# Patient Record
Sex: Male | Born: 1960 | Race: Black or African American | Hispanic: No | Smoking: Current every day smoker
Health system: Southern US, Community
[De-identification: ages and names within clinical notes are randomized; demographics above are authoritative.]

## PROBLEM LIST (undated history)

## (undated) DIAGNOSIS — E785 Hyperlipidemia, unspecified: Secondary | ICD-10-CM

## (undated) DIAGNOSIS — R55 Syncope and collapse: Secondary | ICD-10-CM

## (undated) DIAGNOSIS — I10 Essential (primary) hypertension: Secondary | ICD-10-CM

## (undated) DIAGNOSIS — Z86010 Personal history of colonic polyps: Secondary | ICD-10-CM

## (undated) DIAGNOSIS — T7840XA Allergy, unspecified, initial encounter: Secondary | ICD-10-CM

## (undated) DIAGNOSIS — C61 Malignant neoplasm of prostate: Secondary | ICD-10-CM

## (undated) DIAGNOSIS — M1990 Unspecified osteoarthritis, unspecified site: Secondary | ICD-10-CM

## (undated) HISTORY — DX: Hyperlipidemia, unspecified: E78.5

## (undated) HISTORY — DX: Personal history of colonic polyps: Z86.010

## (undated) HISTORY — PX: PROSTATE BIOPSY: SHX241

## (undated) HISTORY — PX: COLONOSCOPY: SHX174

## (undated) HISTORY — DX: Allergy, unspecified, initial encounter: T78.40XA

## (undated) HISTORY — PX: HEMORRHOID SURGERY: SHX153

## (undated) HISTORY — DX: Syncope and collapse: R55

## (undated) HISTORY — PX: INGUINAL HERNIA REPAIR: SUR1180

---

## 1998-05-29 ENCOUNTER — Emergency Department (HOSPITAL_COMMUNITY): Admission: EM | Admit: 1998-05-29 | Discharge: 1998-05-29 | Payer: Self-pay | Admitting: Emergency Medicine

## 1999-01-27 ENCOUNTER — Emergency Department (HOSPITAL_COMMUNITY): Admission: EM | Admit: 1999-01-27 | Discharge: 1999-01-27 | Payer: Self-pay | Admitting: Emergency Medicine

## 2002-01-10 ENCOUNTER — Emergency Department (HOSPITAL_COMMUNITY): Admission: EM | Admit: 2002-01-10 | Discharge: 2002-01-10 | Payer: Self-pay | Admitting: Emergency Medicine

## 2002-10-02 ENCOUNTER — Encounter: Payer: Self-pay | Admitting: Emergency Medicine

## 2002-10-02 ENCOUNTER — Emergency Department (HOSPITAL_COMMUNITY): Admission: EM | Admit: 2002-10-02 | Discharge: 2002-10-02 | Payer: Self-pay | Admitting: Emergency Medicine

## 2005-04-21 ENCOUNTER — Encounter: Payer: Self-pay | Admitting: Emergency Medicine

## 2005-04-22 ENCOUNTER — Observation Stay (HOSPITAL_COMMUNITY): Admission: EM | Admit: 2005-04-22 | Discharge: 2005-04-22 | Payer: Self-pay | Admitting: Internal Medicine

## 2005-08-02 ENCOUNTER — Ambulatory Visit (HOSPITAL_BASED_OUTPATIENT_CLINIC_OR_DEPARTMENT_OTHER): Admission: RE | Admit: 2005-08-02 | Discharge: 2005-08-02 | Payer: Self-pay | Admitting: General Surgery

## 2005-08-02 ENCOUNTER — Encounter (INDEPENDENT_AMBULATORY_CARE_PROVIDER_SITE_OTHER): Payer: Self-pay | Admitting: Specialist

## 2006-03-22 DIAGNOSIS — R55 Syncope and collapse: Secondary | ICD-10-CM

## 2006-03-22 HISTORY — DX: Syncope and collapse: R55

## 2006-09-18 ENCOUNTER — Ambulatory Visit: Payer: Self-pay | Admitting: Sports Medicine

## 2006-09-18 ENCOUNTER — Observation Stay (HOSPITAL_COMMUNITY): Admission: EM | Admit: 2006-09-18 | Discharge: 2006-09-19 | Payer: Self-pay | Admitting: Emergency Medicine

## 2006-09-19 ENCOUNTER — Encounter (INDEPENDENT_AMBULATORY_CARE_PROVIDER_SITE_OTHER): Payer: Self-pay | Admitting: Sports Medicine

## 2008-02-16 IMAGING — CR DG CHEST 1V PORT
1 series · 1 of 1 positions shown · non-contrast
Comparison: 04/21/05.

CLINICAL DATA: 46 year old male; syncope.
 PORTABLE SINGLE VIEW CHEST RADIOGRAPH ? 09/19/06:

[view not recorded]
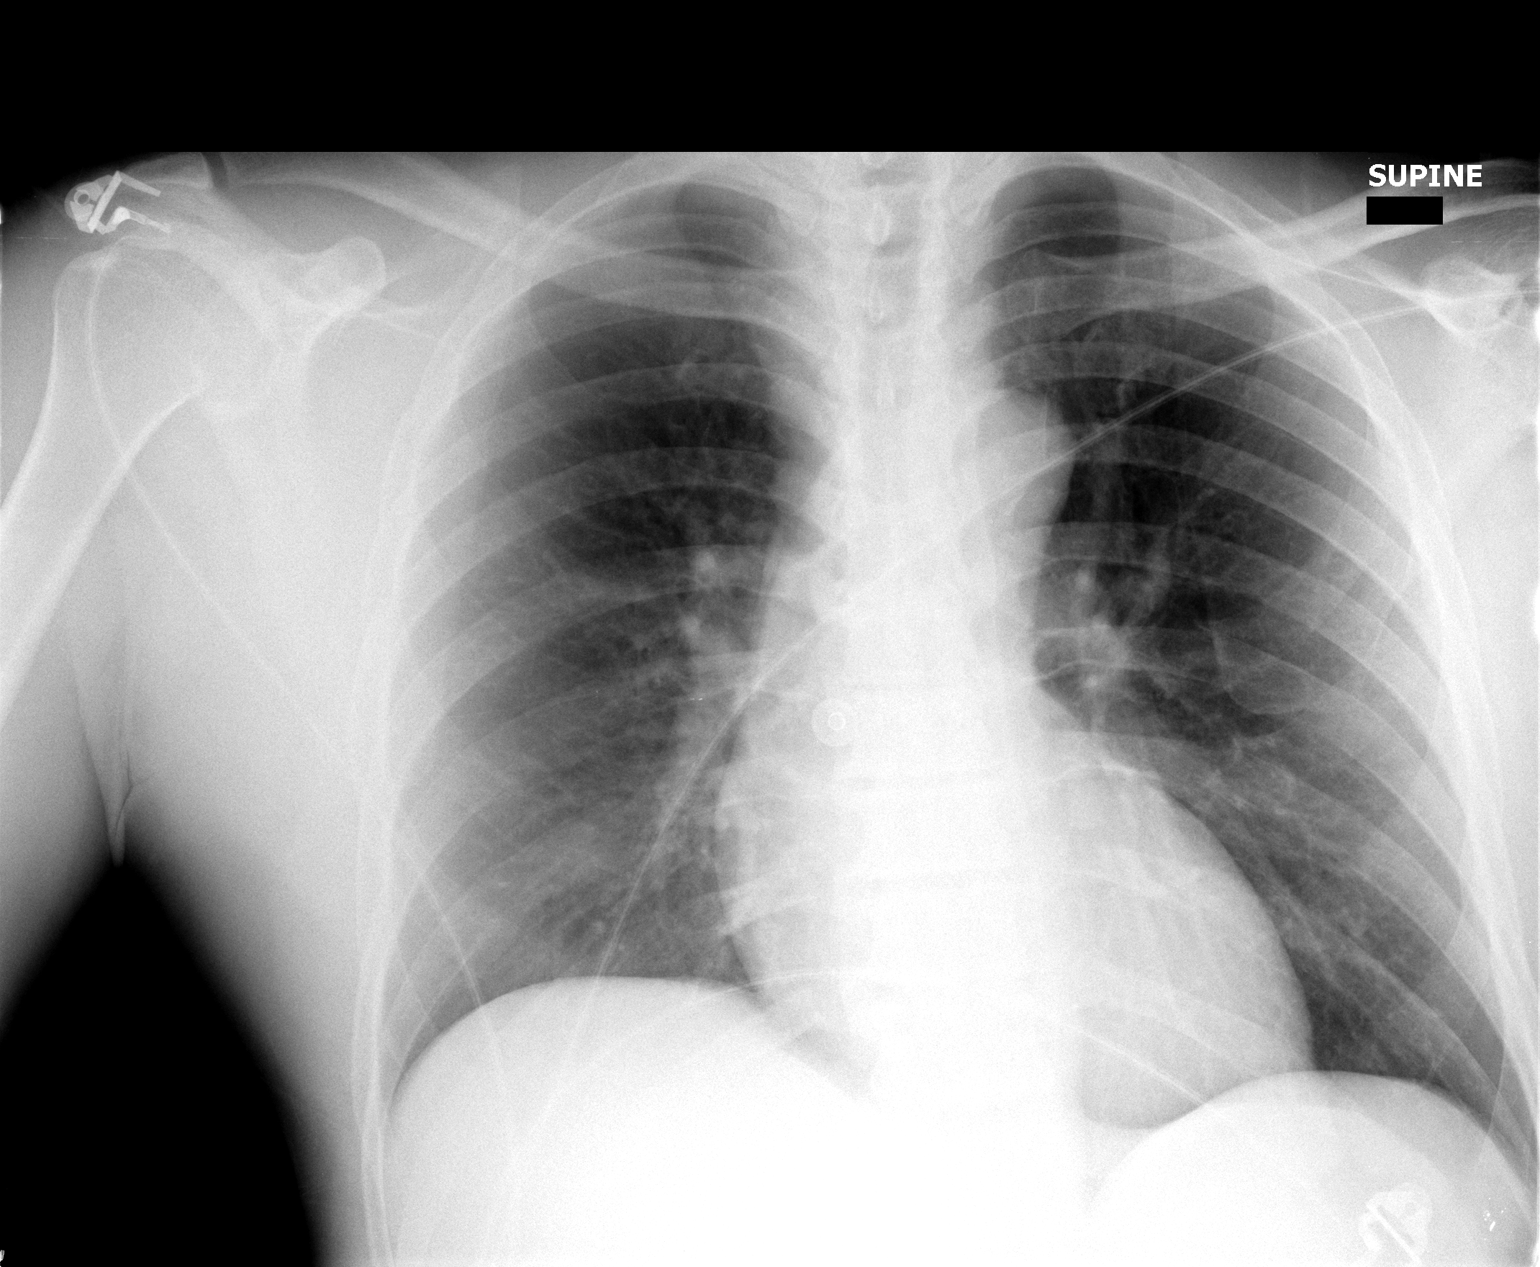

[1 of 1 positions shown; findings below may reference images not displayed]

FINDINGS: The heart size and mediastinal contours are within normal limits.  Both lungs are clear.
IMPRESSION: No acute disease.

## 2010-08-04 NOTE — H&P (Signed)
NAME:  Jeffrey Harrington, Jeffrey Harrington NO.:  [id number]   MEDICAL RECORD NO.:  [id number]          PATIENT TYPE:  INP   LOCATION:  4733                         FACILITY:  [facility]   PHYSICIAN:  Benn Moulder, M.D.      DATE OF BIRTH:  February 20, 1961   DATE OF ADMISSION:  09/18/2006  DATE OF DISCHARGE:                              HISTORY & PHYSICAL   CHIEF COMPLAINT:  Syncope, not feeling well.   HISTORY OF PRESENT ILLNESS:  The patient is a 50 year old African  American male with past medical history of hypertension, not currently  taking his medications for the past 6 months, who began feeling dizzy  Friday morning.  At a grocery store, he suddenly blacked out for a few  seconds.  He continued to feel fatigued and dizzy, but went to work.  He  denies any chest pain, shortness of breath, palpitations.  He has no  prior history of syncope.  Over the weekend, he has continued to feel  weak and dizzy with some intermittent diaphoresis as well as  intermittent wheezing and congestion.  He denies any fevers.  He went to  urgent care this morning because he is still feeling bad and is found to  be hypertensive with blood pressure of 197/111.  He does have a  significant drinking history, drinking approximately 5 beers per day  with last beers last night.  He denies ever having an alcohol withdrawal  symptoms.  He was transferred from urgent care to [facility]  for further evaluation.   PAST MEDICAL HISTORY:  1. Hypertension.  2. Tobacco abuse.  3. Alcohol abuse.  4. Right inguinal hernia status post repair in May 2007.   CURRENT MEDICATIONS:  None.   ALLERGIES:  NO KNOWN DRUG ALLERGIES.   FAMILY HISTORY:  Mom with hypertension and diabetes.  Dad died of liver  disease secondary to alcohol abuse.  Grandfather with stroke.  Brother  died at age 36 with throat cancer.   SOCIAL HISTORY:  He works at a [employer] forces logistics/support/administrative officer.  He is married with 2  children.  Positive tobacco use,  1/2-pack per day.  Drinks 5 to 6 beers  per day.  No history of alcohol withdrawal or DTs.  Positive for 2/4 on  the [facility] questionnaire, wanting to cut back and feeling annoyed when  people are asking about his drinking.  Denies any other drug use.   REVIEW OF SYSTEMS:  No nausea, vomiting, diarrhea, chest pain, shortness  of breath, or palpitations.  No headaches or vision changes.  Remainder  of systems as per HPI.   PHYSICAL EXAMINATION:  VITAL SIGNS:  Temperature 97.4, pulse 95, blood  pressure 187/100, respiratory rate 20, 99% on room air, peak flow was  450 at [place].  GENERAL:  He is alert, in no acute distress.  HEENT:  Pupils are equally round and reactive to light and  accommodation.  Extraocular motion are intact.  Normal optic fundi  bilaterally.  NECK:  No carotid bruits, no JVD, no thyromegaly, or thyroid nodules.  CARDIOVASCULAR:  Regular rate and rhythm.  A 2/6 systolic ejection  murmur, right upper sternal border that does not radiate.  LUNGS:  No increased work of breathing, occasional and expiratory  wheeze.  ABDOMEN:  Positive bowel sounds.  Soft, nontender, nondistended.  EXTREMITIES:  No cyanosis, clubbing, or edema.  A 2+ radial and dorsalis  pedis and posterior tibialis pulses.  NEURO:  Alert and oriented x3.  Cranial nerves II-XII are grossly  intact.  A 5/5 strength.   LABORATORY DATA:  At [facility], white blood cell count 5.7,  hemoglobin 16.4, hematocrit 48, platelet count 293.   Chest x-ray showed hyperinflation with no focal infiltrate.  EKG normal  sinus rhythm with left ventricular hypertrophy, but no change from prior  EKGs.   ASSESSMENT AND PLAN:  A 50 year old male with hypertension, not  currently on medications, now syncopal event.  1. Syncope.  Broad differential diagnosis including neurocardiogenic,      cardiogenic causes or orthostasis or substance abuse.  We will      monitor on telemetry.  We will check cardiac enzymes.  We  will      check orthostatic's as well as urine drug screen and blood alcohol      level.  The patient will likely need an outpatient cardiology      workup if cardiac enzymes are negative.  We will check fasting      lipid panels, further stratify.  2. Hypertension, likely chronic.  No signs or symptoms of hypertensive      emergency at this point.  We will monitor for an organ damage.  We      will start lisinopril/HCTZ initially.  We will certainly add      intravenous medications if he develops signs of hypertensive      emergency.  We will check urine drug screen and echocardiogram.  3. Pulmonary.  Patient with hyperinflation on chest x-ray.  History of      smoking.  He does have a little bit of wheezing on exam.  Suspect      some underlying chronic obstructive pulmonary disease.  We will      start albuterol neb treatments and check pulmonary function tests      before and after bronchodilators.  He did a _______ with normal      white counts and pneumonia most likely.  We will re-hydrate.  Check      chest x-ray again in the morning.  4. Alcohol use.  We will start Ativan as needed.  Watch closely for      signs or symptoms of withdrawal.  We will given thymine and folate      and check blood alcohol level.  5. Tobacco abuse.  Will obtain smoking cessation consult.  The patient      does not want patch.  The patient is strongly encouraged to stop      smoking given his comorbidities.      Benn Moulder, M.D.     MR/MEDQ  D:  09/18/2006  T:  09/19/2006  Job:  [id number]

## 2010-08-07 NOTE — Op Note (Signed)
NAME:  Jeffrey Harrington, MANGEL NO.:  [id number]   MEDICAL RECORD NO.:  [id number]          PATIENT TYPE:  AMB   LOCATION:  DSC                          FACILITY:  [facility]   PHYSICIAN:  Ollen Gross. Vernell Morgans, M.D. DATE OF BIRTH:  [birth year]   DATE OF PROCEDURE:  08/05/2005  DATE OF DISCHARGE:  08/02/2005                                 OPERATIVE REPORT   PREOPERATIVE DIAGNOSES:  Right inguinal hernia.   POSTOP DIAGNOSIS:  Right indirect inguinal hernia.   PROCEDURE:  Right inguinal repair with mesh.   SURGEON:  Dr. Carolynne Edouard.   ANESTHESIA:  General via LMA.   PROCEDURE:  After informed consent was obtained, the patient brought to the  operating room placed in supine position operating room table.  After  induction of general anesthesia, the patient's right groin and abdomen were  prepped with Betadine and draped in usual sterile manner.  The area of right  groin was infiltrated 0.25% Marcaine with epinephrine.  A small incision was  made from the edge of the pubic tubercle on the right towards the anterior  iliac spine for a distance of about 5 cm.  This incision was carried down  through the skin and subcutaneous tissue sharply with electrocautery.  A  small bridging vein was encountered that was clamped with hemostats, divided  and ligated with 3-0 silk ties.  The rest of the dissection was carried  sharply through the subcutaneous tissue with electrocautery until the fascia  of the external oblique was encountered.  The fascia of the external oblique  was opened along its fibers with a 15 blade knife and Metzenbaum scissors  towards the apex of the external ring.  Once this was accomplished,  Weitlaner retractor was deployed.  Blunt dissection was then carried out of  the cord structures at the edge of the pubic tubercle until the cord  structures could be surrounded between two fingers.  A 1/2-inch Penrose  drain was then placed around the cord structures for retraction  purposes.  There did not appear to be any direct defect.  The cord structures were then  gently skeletonized by combination of blunt hemostat dissection and sharp  dissection with electrocautery.  The hernia sac was identified and gently  separated from the rest of the cord structures.  The hernia sac was then  opened sharply with the Metzenbaum scissors.  There were no visceral  contents within the sac.  The sac was then ligated near its base with a 2-0  silk suture ligature.  The distal sac was excised sharply with the  Metzenbaum scissors and the stump of the sac was allowed to retract back  beneath the transversalis layer.  Next a 3 x 6 piece of Ultrapro mesh was  chosen and cut to fit.  The mesh was sewed inferiorly to the shelving edge  of inguinal ligament with a running 2-0 Prolene stitch.  Tails were cut in  the mesh laterally and the tails of the mesh were wrapped around the cord  structures superiorly.  The mesh was sewed to the muscular aponeurotic  strength layer of the transversalis with interrupted 2-0 Prolene vertical  mattress stitches.  The tails were anchored laterally to the shelving edge  of the inguinal ligament with an interrupted 2-0 Prolene stitch.  Once this  was accomplished, the mesh was in good position without any tension.  The  wound was irrigated copious amounts of saline.  The ilioinguinal nerve was  also identified and was involved with a fair bit of scar tissue.  The nerve  was then therefore clamped proximally and distally with hemostats divided  and ligated with 3-0 silk ties.  The external oblique fascia was then  reapproximated with a running 2-0 Vicryl stitch.  The wound was infiltrated  with more 0.25% Marcaine with epinephrine.  The subcutaneous fascia was  closed with a running 3-0 Vicryl stitch and the skin was closed with a  running 4-0 Monocryl subcuticular stitch.  Benzoin, Steri-Strips and sterile  dressings were  applied.  The patient  tolerated well.  At the end of the case, all needle,  sponge and instrument counts correct.  The patient was awakened and taken  recovery in stable condition.  The patient's testicle was in the sac at the  end of the case.      Ollen Gross. Vernell Morgans, M.D.  Electronically Signed     PST/MEDQ  D:  08/05/2005  T:  08/05/2005  Job:  [id number]

## 2010-08-07 NOTE — Discharge Summary (Signed)
NAME:  Jeffrey Harrington, Jeffrey Harrington NO.:  [id number]   MEDICAL RECORD NO.:  [id number]          PATIENT TYPE:  INP   LOCATION:  6705                         FACILITY:  [facility]   PHYSICIAN:  Elliot Cousin, M.D.    DATE OF BIRTH:  08/26/1960   DATE OF ADMISSION:  04/21/2005  DATE OF DISCHARGE:  04/22/2005                                 DISCHARGE SUMMARY   DISCHARGE DIAGNOSES:  1.  Hypertensive urgency.  2.  Right inguinal hernia, reducible.  3.  Hypokalemia.  4.  Tobacco abuse.   DISCHARGE MEDICATIONS:  1.  Metoprolol 50 mg, half a tablet b.i.d.  2.  Hydrochlorothiazide 25 mg, half a tablet daily.  3.  Potassium chloride 20 mEq, half a tablet daily take with HCTZ.   HISTORY OF PRESENT ILLNESS:  The patient is a 50 year old man with a past  medical history significant for tobacco use, who presented to the emergency  department with a chief complaint of right groin pain and bulge.  The  patient was treated for an upper respiratory infection a couple of weeks  ago.  However, during the course of the upper respiratory infection, he had  multiple episodes of coughing.  He developed a sharp pain with a bulge in  his right groin.  After a couple of days the pain and bulge went away.  The  right groin pain and bulge again returned following lifting.  He has been  able to push the bulge back in each time.  However, he decided to present to  the emergency department for further evaluation.  When the patient was  evaluated in the emergency department, he was found to have a diastolic  blood pressure ranging from 120 to 143.  The patient was, therefore,  admitted for further evaluation and management.   HOSPITAL COURSE:  1.  HYPERTENSIVE URGENCY.  Treatment started by the emergency department      physician Dr. Adriana Simas. The patient was given intravenous labetalol twice.      He was subsequently started on a nitroglycerin drip.  His chest x-ray,      on admission, revealed no acute  cardiopulmonary disease.  His EKG, on      admission, revealed normal sinus rhythm, nonspecific T-wave and ST wave      abnormalities, and a prolonged QT interval.  The patient had no      complaints of chest pain or shortness of breath on admission.  He was      eventually admitted to the step-down unit.  The nitroglycerin drip was      tapered off and the patient was started on labetalol 100 mg p.o. b.i.d.      After several hours of hospitalization, the patient's blood pressures      improved.  The patient wanted to go home following the improvement in      his blood pressures.  A repeat EKG revealed normal sinus rhythm and a      prolonged QT interval (161,096).  A magnesium level was assessed prior      to hospital discharge and  was found to be within normal limits at 2.1.      The patient's request was granted and he was sent home essentially      during the first 24 hours of hospitalization.  At the time of hospital      discharge, the labetalol was discontinued and the patient was started on      metoprolol 50 mg, half a tablet b.i.d. instead. In addition,      hydrochlorothiazide 25 mg, half a tablet daily was added.  The patient      was instructed on a low-salt diet.  He was also admonished to stop      smoking.  More importantly, he was advised to follow up with Dr.      Ronne Binning on a regular basis for routine monitoring of his blood      pressure. A follow-up EKG is recommended.  The patient was informed that      his cholesterol panel and thyroid panel were pending.  He was advised to      ask Dr. Ronne Binning to follow up with these lab results, when he returns to      Dr. Dimas Millin office for hospital followup.   1.  RIGHT INGUINAL HERNIA.  The patient's hernia was manually reduced by the      emergency department physician without any complications.  There was no      evidence of incarceration on examination.  On followup examinations by      the dictating physician, the  patient had no evidence of an incarcerated      or strangulated right groin/inguinal hernia. The patient was advised to      follow up with Dr. Ronne Binning. An elective surgical evaluation is      recommended.   1.  HYPOKALEMIA.  The patient's serum potassium was 3.3 at the time of      hospital admission.  He was treated with 30 mEq of potassium chloride      prior to hospital discharge.  His magnesium level, as stated above, was      within normal limits at 2.1.  He was discharged to home with a      prescription for potassium chloride.   1.  TOBACCO ABUSE.  The patient was admonished to stop smoking.   PENDING LABORATORY:  Fasting lipid panel, TSH.      Elliot Cousin, M.D.  Electronically Signed     DF/MEDQ  D:  04/24/2005  T:  04/24/2005  Job:  [id number]   cc:   Lorelle Formosa, M.D.  Fax: [phone]

## 2010-08-07 NOTE — Discharge Summary (Signed)
NAME:  Jeffrey Harrington, Jeffrey Harrington NO.:  [id number]   MEDICAL RECORD NO.:  [id number]          PATIENT TYPE:  OBV   LOCATION:  4733                         FACILITY:  [facility]   PHYSICIAN:  Melina Fiddler, MD DATE OF BIRTH:  May 22, 1960   DATE OF ADMISSION:  09/18/2006  DATE OF DISCHARGE:  09/19/2006                               DISCHARGE SUMMARY   DISCHARGE DIAGNOSES:  1. Syncope.  2. Hypertension.  3. Wheezing and hyperinflation on chest x-ray.  4. Alcohol use.  5. Tobacco abuse.   DISCHARGE MEDICATIONS:  1. Aspirin 81 mg daily.  2. Lisinopril/HCTZ 20 mg/25 mg daily.  3. Albuterol inhaler two puffs inhaled every four hours as needed for      shortness of breath.   CONSULTS:  None.   PROCEDURES:  A 2-D echocardiogram showed normal left ventricular  systolic function with an EF of 60%, no left ventricular regional wall  abnormalities, mildly increased left ventricular wall thickness.  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   LABORATORY DATA:  With a cholesterol of 194, triglycerides of 82, HDL  cholesterol of 86, LDL cholesterol of 92.  Three sets of cardiac enzymes  were negative.  White blood count was 5.1, hemoglobin was 15.8,  hematocrit 47.3, platelets of 222.  Sodium of 137, potassium of 4.2,  chloride of 96, bicarb of 32, glucose of 103, BUN of 8, creatinine of  0.93, calcium of 9.4.  TSH of 1.244.   HOSPITAL COURSE:  Jeffrey Harrington is a 49 year old African-American with a  past medical history of hypertension who had not been taking his  medications for six months who had began to feel dizzy prior to  admission.  He blacked out for a few seconds at a grocery store and  continued to feel fatigue and dizzy with some intermittent diaphoresis  as well as intermittent wheezing.  He was found to have a blood pressure  at urgent care of 197/111 and he was transferred to the [facility] emergency room for admission for elevated blood pressure.   1. Syncope.  Patient has a broad differential for his episode of      syncope.  We admitted him, checked cardiac enzymes which were all      negative.  In addition, the patient's EKG showed no abnormalities      and he had no abnormalities on telemetry during the hospital stay.      A UDS was negative.  A blood alcohol level was negative.  Patient      was not orthostatic.  Patient's syncope likely could be from      severely elevated blood pressure.  It was recommended at time of      discharge that the patient follow up with a cardiologist for      further risk stratification and stress testing.  Lipid panel was      within normal limits and at goal.  2. Hypertension.  Patient had no signs or symptoms of hypertensive      emergency at the point of this admission.  We restarted the      patient's lisinopril and HCTZ; the patient had improved blood      pressure control on this medication, likely will need additional      medications in the future.  He will follow up with his primary care      physician regarding the need for additional medications.  3. Wheezing and hyperinflation on chest x-ray.  The patient has a      history of smoking and had wheezing on exam.  We suspected during      this admission that the patient had some underlying chronic      obstructive pulmonary disease.  The patient's wheezing did resolve      with albuterol neb treatments so it is possible that this is a      component of asthma as well.  We recommend pulmonary function tests      as an outpatient upon discharge.  4. Alcohol use.  Patient had a negative alcohol level on admission,      but does have a significant history of alcohol use and we did order      p.r.n. Ativan as needed for signs and symptoms of withdrawal;      patient had none of these during the hospital stay.  5. Tobacco abuse.  We obtained a smoking cessation consult during the      hospitalization and patient was motivated to quit.  The  patient is      to follow up at [facility] upon discharge.      Levander Campion, M.D.  Electronically Signed     ______________________________  Melina Fiddler, MD    JH/MEDQ  D:  10/04/2006  T:  10/05/2006  Job:  [id number]

## 2010-08-07 NOTE — H&P (Signed)
NAME:  Jeffrey Harrington, MAHON NO.:  [id number]   MEDICAL RECORD NO.:  [id number]          PATIENT TYPE:  EMS   LOCATION:  ED                           FACILITY:  [facility]   PHYSICIAN:  Hettie Holstein, D.O.    DATE OF BIRTH:  09-26-1960   DATE OF ADMISSION:  04/21/2005  DATE OF DISCHARGE:                                HISTORY & PHYSICAL   PRIMARY CARE PHYSICIAN:  Lorelle Formosa, M.D.   CHIEF COMPLAINT:  Chest cold.   HISTORY OF PRESENT ILLNESS:  Jeffrey Harrington is a pleasant 50 year old relatively  healthy African-American male who has not seen a primary care physician in  over a year.  He did present to [facility] about a couple of weeks ago with  some upper respiratory congestion and he was told that he had borderline  high blood pressure.  In any event, he had been doing okay until he  developed this cough and he developed a sharp pain in his right groin on  Sunday night and subsequently this went away after a couple of days, though  he had to remain off of work for a couple of days.  On Tuesday while he was  lifting a box for his wife, he developed his right groin pain and subsequent  bulge.  He states that he was able to push it back in, but every time he  coughed it would pop off.  Subsequently he presented to the emergency  department and this was reduced.  There was no evidence of incarceration on  examination.   However, prior to being discharged home by the emergency department, he was  noted to have hypertensive urgency refractory to ED therapies.  He is being  admitted for management of hypertensive urgency at this time with diastolics  consistently over 120 and going as high as 143 diastolic.  In any event, he  states that he has no prior history of hypertension in the past.   PAST MEDICAL HISTORY:  This is unremarkable.  He has not had any problems  with hypertension or diabetes in the past.  He has been relatively healthy  according to him.   MEDICATIONS:   He takes no medications.   ALLERGIES:  No known drug allergies.   FAMILY HISTORY:  His mother is alive at age 30, father died at age 81 as a  result of chronic alcohol abuse.   SOCIAL HISTORY:  The patient has been smoking for about 15 years, half a  pack per day.  He works in Teaching laboratory technician and receiving.  He is married and has  three children and drinks beer occasionally.   REVIEW OF SYSTEMS:  He has been in his usual state of health.  No swelling,  no chest pain.  Just this upper respiratory tract infection that has been  persistent.   PHYSICAL EXAMINATION:  VITAL SIGNS:  Blood pressure 153/110, heart rate was  in the 80's, respirations 20, O2 saturation 95%.  He was afebrile.  GENERAL:  The patient is alert and in no acute distress.  NECK:  Supple and  nontender.  No palpable mass.  CARDIOVASCULAR:  Normal S1 and S2, nondisplaced PMI.  LUNGS:  Clear to auscultation bilaterally.  ABDOMEN:  Soft.  He does have palpable defect in his right inguinal area  above the ilioinguinal ligament.  There is no palpable incarcerated bowel.  His bowel sounds are normal active and he is currently without pain or  discomfort.  EXTREMITIES:  There is no edema.  Peripheral pulses are symmetrical and  palpable bilaterally.   LABORATORY DATA:  There is none obtained in the emergency department.  I am  ordering these at this time as well as the EKG which has not been ordered in  the emergency department as well.   ASSESSMENT:  1.  Hypertensive urgency.  2.  Right inguinal hernia reducible.  3.  Tobacco abuse.   PLAN:  We are going to admit Jeffrey Harrington to a CCU floor where he can receive  aggressive IV therapy for his hypertensive urgency and we will check some  routine labs including CBC, BNP, EKG, fasting lipid profile.  He is going to  need continuity with a primary care physician.  We will initiate Normodyne  for now to adequately control his blood pressure.  He may need further  adjustments in  the outpatient setting as well as referral to a general  surgeon to address his right inguinal hernia.      Hettie Holstein, D.O.  Electronically Signed     ESS/MEDQ  D:  04/22/2005  T:  04/22/2005  Job:  [id number]   cc:   Lorelle Formosa, M.D.  Fax: [phone]

## 2011-01-01 ENCOUNTER — Encounter: Payer: Self-pay | Admitting: *Deleted

## 2011-01-01 ENCOUNTER — Emergency Department (HOSPITAL_BASED_OUTPATIENT_CLINIC_OR_DEPARTMENT_OTHER)
Admission: EM | Admit: 2011-01-01 | Discharge: 2011-01-01 | Disposition: A | Discharge status: Home / Self Care | Payer: 59 | Attending: Emergency Medicine | Admitting: Emergency Medicine

## 2011-01-01 DIAGNOSIS — F17200 Nicotine dependence, unspecified, uncomplicated: Secondary | ICD-10-CM | POA: Insufficient documentation

## 2011-01-01 DIAGNOSIS — I10 Essential (primary) hypertension: Secondary | ICD-10-CM | POA: Insufficient documentation

## 2011-01-01 DIAGNOSIS — M79609 Pain in unspecified limb: Secondary | ICD-10-CM | POA: Insufficient documentation

## 2011-01-01 DIAGNOSIS — Z79899 Other long term (current) drug therapy: Secondary | ICD-10-CM | POA: Insufficient documentation

## 2011-01-01 DIAGNOSIS — M79603 Pain in arm, unspecified: Secondary | ICD-10-CM

## 2011-01-01 HISTORY — DX: Essential (primary) hypertension: I10

## 2011-01-01 MED ORDER — PREDNISONE 20 MG PO TABS
ORAL_TABLET | ORAL | Status: AC
  Administered 2011-01-01: 20:00:00
  Filled 2011-01-01: qty 1

## 2011-01-01 MED ORDER — PREDNISONE 10 MG PO TABS: 20.0000 mg | ORAL_TABLET | Freq: Every day | ORAL | Status: AC

## 2011-01-01 MED ORDER — IBUPROFEN 400 MG PO TABS
400.0000 mg | ORAL_TABLET | Freq: Once | ORAL | Status: AC
  Administered 2011-01-01: 400 mg via ORAL
  Filled 2011-01-01: qty 1

## 2011-01-01 MED ORDER — DIAZEPAM 5 MG PO TABS
5.0000 mg | ORAL_TABLET | Freq: Once | ORAL | Status: AC
  Administered 2011-01-01: 5 mg via ORAL
  Filled 2011-01-01: qty 1

## 2011-01-01 MED ORDER — HYDROMORPHONE HCL 1 MG/ML IJ SOLN
1.0000 mg | Freq: Once | INTRAMUSCULAR | Status: AC
  Administered 2011-01-01: 1 mg via INTRAMUSCULAR
  Filled 2011-01-01: qty 1

## 2011-01-01 MED ORDER — PREDNISONE 20 MG PO TABS
40.0000 mg | ORAL_TABLET | Freq: Once | ORAL | Status: AC
  Administered 2011-01-01: 40 mg via ORAL
  Filled 2011-01-01: qty 1

## 2011-01-01 MED ORDER — OXYCODONE-ACETAMINOPHEN 5-325 MG PO TABS: 2.0000 | ORAL_TABLET | ORAL | Status: AC | PRN

## 2011-01-01 NOTE — ED Notes (Signed)
Pt sts his left hand began hurting last week and the pain started radiating up his left arm. Pt sts the pain also started in his right hand 2 days ago.

## 2011-01-05 NOTE — ED Provider Notes (Signed)
History    50yM with b/l arm paibn. Gradual onset almost a week ago. First L hand and wrist and now R arm starting 2-3d ago. 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 No fever or chills. No rash. No numbness,tingling or loss of strength. No hx of similar symptoms. No cp or sob. CSN: [id number] Arrival date & time: 01/01/2011  5:56 PM  Chief Complaint  Patient presents with  . Arm Pain    (Consider location/radiation/quality/duration/timing/severity/associated sxs/prior treatment) Patient is a 50 y.o. male presenting with arm pain.  Arm Pain    Past Medical History  Diagnosis Date  . Hypertension     Past Surgical History  Procedure Date  . Hemorrhoid surgery     No family history on file.  History  Substance Use Topics  . Smoking status: Current Everyday Smoker  . Smokeless tobacco: Not on file  . Alcohol Use: Yes      Review of Systems   Review of symptoms negative unless otherwise noted in HPI.   Allergies  Review of patient's allergies indicates no known allergies.  Home Medications   Current Outpatient Rx  Name Route Sig Dispense Refill  . ACETAMINOPHEN 500 MG PO TABS Oral Take 1,000 mg by mouth once.      Marland Kitchen AMLODIPINE BESYLATE 10 MG PO TABS Oral Take 10 mg by mouth daily.      Marland Kitchen CLONIDINE HCL 0.3 MG PO TABS Oral Take 0.3 mg by mouth 2 (two) times daily.      Marland Kitchen HYDROCHLOROTHIAZIDE 25 MG PO TABS Oral Take 25 mg by mouth daily.      . IBUPROFEN 200 MG PO TABS Oral Take 200 mg by mouth every 6 (six) hours as needed. For pain     . OXYCODONE-ACETAMINOPHEN 5-325 MG PO TABS Oral Take 2 tablets by mouth every 4 (four) hours as needed for pain. 15 tablet 0  . PREDNISONE 10 MG PO TABS Oral Take 2 tablets (20 mg total) by mouth daily. 10 tablet 0  . PREDNISONE 10 MG PO TABS Oral Take 2 tablets (20 mg total) by mouth daily. 10 tablet 0    BP 140/101  Pulse 90  Temp(Src) 98.3 F (36.8 C) (Oral)  Resp 18  SpO2 100%  Physical Exam  Nursing note and vitals  reviewed. Constitutional: He appears well-developed and well-nourished. No distress.  HENT:  Head: Normocephalic and atraumatic.  Eyes: Conjunctivae are normal. Right eye exhibits no discharge. Left eye exhibits no discharge.  Neck: Neck supple.  Cardiovascular: Normal rate, regular rhythm and normal heart sounds.  Exam reveals no gallop and no friction rub.   No murmur heard. Pulmonary/Chest: Effort normal and breath sounds normal. No respiratory distress.  Abdominal: Soft. He exhibits no distension. There is no tenderness.  Musculoskeletal:       Moderate tenderness b/l forearms R>L. No rash. neurovascuallry intact distally. No significant swelling. FROM.  Neurological: He is alert.  Skin: Skin is warm and dry.  Psychiatric: He has a normal mood and affect. His behavior is normal. Thought content normal.    ED Course  Procedures (including critical care time)  Labs Reviewed - No data to display No results found.   1. Arm pain       MDM  50yM with b/l forearm and wrist pain. Suspect overuse injury. Plan symptomatic tx and outptfu. Neuro exam nonfocal. Afebrile. Doubt infectious or blood clot.        Raeford Razor, MD 01/05/11 1004

## 2011-01-06 LAB — LIPID PANEL
LDL Cholesterol: 92
LDL Cholesterol: 97
Total CHOL/HDL Ratio: 2.2
Total CHOL/HDL Ratio: 2.3
Triglycerides: 47
Triglycerides: 82
VLDL: 16
VLDL: 9

## 2011-01-06 LAB — CK TOTAL AND CKMB (NOT AT ARMC)
CK, MB: 6.9 — ABNORMAL HIGH
Relative Index: 0.9
Total CK: 1053 — ABNORMAL HIGH
Total CK: 759 — ABNORMAL HIGH

## 2011-01-06 LAB — CBC
Platelets: 222
WBC: 5.1

## 2011-01-06 LAB — URINE MICROSCOPIC-ADD ON

## 2011-01-06 LAB — BASIC METABOLIC PANEL
BUN: 8
Calcium: 9.4
Creatinine, Ser: 0.93
GFR calc Af Amer: >60
GFR calc non Af Amer: >60

## 2011-01-06 LAB — URINALYSIS, ROUTINE W REFLEX MICROSCOPIC
Glucose, UA: NEGATIVE
Hgb urine dipstick: NEGATIVE
Ketones, ur: 15 — AB
Nitrite: NEGATIVE
Specific Gravity, Urine: 1.022
pH: 7

## 2011-01-06 LAB — COMPREHENSIVE METABOLIC PANEL
ALT: 32
AST: 58 — ABNORMAL HIGH
Alkaline Phosphatase: 63
CO2: 28
Calcium: 9.4
GFR calc Af Amer: >60
GFR calc non Af Amer: >60
Glucose, Bld: 98
Potassium: 3.9
Sodium: 139
Total Protein: 7.6

## 2011-01-06 LAB — RAPID URINE DRUG SCREEN, HOSP PERFORMED
Amphetamines: NOT DETECTED
Barbiturates: NOT DETECTED
Benzodiazepines: NOT DETECTED
Opiates: NOT DETECTED

## 2011-01-06 LAB — TROPONIN I: Troponin I: 0.01

## 2011-01-06 LAB — ETHANOL: Alcohol, Ethyl (B): <5

## 2012-03-22 HISTORY — PX: HEMORRHOID BANDING: SHX5850

## 2012-05-19 ENCOUNTER — Encounter: Payer: Self-pay | Admitting: Internal Medicine

## 2012-05-19 ENCOUNTER — Telehealth: Payer: Self-pay | Admitting: Internal Medicine

## 2012-05-19 NOTE — Telephone Encounter (Signed)
Pt scheduled to see Dr. Leone Payor Monday 05/22/12@11am . Pt aware of appt date and time.

## 2012-05-19 NOTE — Telephone Encounter (Signed)
Left message for pt to call back  °

## 2012-05-22 ENCOUNTER — Ambulatory Visit (INDEPENDENT_AMBULATORY_CARE_PROVIDER_SITE_OTHER): Payer: BC Managed Care – PPO | Admitting: Internal Medicine

## 2012-05-22 ENCOUNTER — Encounter: Payer: Self-pay | Admitting: Internal Medicine

## 2012-05-22 VITALS — BP 146/70 | HR 88 | Ht 69.25 in | Wt 207.0 lb

## 2012-05-22 DIAGNOSIS — K648 Other hemorrhoids: Secondary | ICD-10-CM

## 2012-05-22 DIAGNOSIS — F17200 Nicotine dependence, unspecified, uncomplicated: Secondary | ICD-10-CM

## 2012-05-22 DIAGNOSIS — Z1211 Encounter for screening for malignant neoplasm of colon: Secondary | ICD-10-CM

## 2012-05-22 MED ORDER — NA SULFATE-K SULFATE-MG SULF 17.5-3.13-1.6 GM/177ML PO SOLN: ORAL | Status: DC

## 2012-05-22 MED ORDER — HYDROCORTISONE ACETATE 25 MG RE SUPP: 25.0000 mg | Freq: Every day | RECTAL | Status: DC

## 2012-05-22 NOTE — Patient Instructions (Addendum)
You have been given a separate informational sheet regarding your tobacco use, the importance of quitting and local resources to help you quit.  You have been scheduled for a colonoscopy with propofol. Please follow written instructions given to you at your visit today.  Please use the sample suprep kit you have been given today. If you use inhalers (even only as needed) or a CPAP machine, please bring them with you on the day of your procedure.  Today you have been given handouts on high fiber and hemorrhoid to read and follow.   We have sent the following medications to your pharmacy for you to pick up at your convenience: [facility]  Thank you for choosing me and  Gastroenterology.  Iva Boop, M.D., [facility]

## 2012-05-22 NOTE — Progress Notes (Signed)
Subjective:    Patient ID: Jeffrey Harrington, male    DOB: [birth year], 52 y.o.   MRN: [id number]  HPI The patient is here because of rectal bleeding - small amount bright red blood in commode and with wiping. Intermittent x a few months. Strains to stool at times but not constipated. No abdominal or ano-rectal pain. Has never had a colonoscopy. Works in a [employer] with co-worker who is a patient of mine. He does lift heavy items. GI ROS otherwise negative. No prior hx rectal bleeding.  No Known Allergies Outpatient Prescriptions Prior to Visit  Medication Sig Dispense Refill  . amLODipine (NORVASC) 10 MG tablet Take 10 mg by mouth daily.        Marland Kitchen acetaminophen (TYLENOL) 500 MG tablet Take 1,000 mg by mouth once.        Marland Kitchen ibuprofen (ADVIL,MOTRIN) 200 MG tablet Take 200 mg by mouth every 6 (six) hours as needed. For pain       . cloNIDine (CATAPRES) 0.3 MG tablet Take 0.3 mg by mouth 2 (two) times daily.        . hydrochlorothiazide (HYDRODIURIL) 25 MG tablet Take 25 mg by mouth daily.            Past Medical History  Diagnosis Date  . Hypertension   . Syncope 2008   Past Surgical History  Procedure Laterality Date  . Hemorrhoid surgery    . Inguinal hernia repair Right    History   Social History  . Marital Status: Married    Spouse Name: N/A    Number of Children: 2  . Years of Education:    Occupational History  . [employer]  Company secretary   Social History Main Topics  . Smoking status: Current Every Day Smoker -- 0.50 packs/day for 20 years    Types: Cigarettes  . Smokeless tobacco: Never Used  . Alcohol Use: 7.0 oz/week    14 drink(s) per week     Comment: 2 per day  . Drug Use: No  .       Family History  Problem Relation Age of Onset  . Diabetes Mother   . Lung cancer Father   . Throat cancer Brother     ?    Review of Systems + recent back pain and a cough All other ROS negative or as per HPI    Objective:   Physical Exam General:   Well-developed, well-nourished and in no acute distress Eyes:  anicteric. ENT:   Mouth and posterior pharynx free of lesions.  Neck:   supple w/o thyromegaly or mass.  Lungs: Clear to auscultation bilaterally. Heart:  S1S2, no rubs, murmurs, gallops. Abdomen:  soft, non-tender, no hepatosplenomegaly, hernia, or mass and BS+.  Rectal: Normal anoderm, normal resting tone, no mass, brown stool. Prostate is normal Anoscopy: Inflamed internal hemorrhoids x 3 columns in anal canal Lymph:  no cervical or supraclavicular adenopathy. Extremities:   no edema Skin   no rash. Neuro:  A&O x 3.  Psych:  appropriate mood and  Affect.      Assessment & Plan:  Hemorrhoids, internal, with bleeding - Plan: hydrocortisone (ANUSOL-HC) 25 MG suppository, high-fiber diet  Special screening for malignant neoplasms, colon - Plan: Ambulatory referral to Gastroenterology - colonoscopy - screening exam because I have identified cause of rectal bleeding as hemorrhoids.The risks and benefits as well as alternatives of endoscopic procedure(s) have been discussed and reviewed. All questions answered. The patient agrees to proceed.  Smoker  counseled to quit and resources provided

## 2012-05-26 ENCOUNTER — Encounter: Payer: Self-pay | Admitting: Internal Medicine

## 2012-05-26 ENCOUNTER — Ambulatory Visit (AMBULATORY_SURGERY_CENTER): Payer: BC Managed Care – PPO | Admitting: Internal Medicine

## 2012-05-26 VITALS — BP 146/80 | HR 75 | Temp 98.1°F | Resp 12 | Ht 69.0 in | Wt 207.0 lb

## 2012-05-26 DIAGNOSIS — K648 Other hemorrhoids: Secondary | ICD-10-CM

## 2012-05-26 DIAGNOSIS — D126 Benign neoplasm of colon, unspecified: Secondary | ICD-10-CM

## 2012-05-26 DIAGNOSIS — Z1211 Encounter for screening for malignant neoplasm of colon: Secondary | ICD-10-CM

## 2012-05-26 MED ORDER — SODIUM CHLORIDE 0.9 % IV SOLN: 500.0000 mL | INTRAVENOUS | Status: DC

## 2012-05-26 NOTE — Progress Notes (Signed)
Patient did not experience any of the following events: a burn prior to discharge; a fall within the facility; wrong site/side/patient/procedure/implant event; or a hospital transfer or hospital admission upon discharge from the facility. (G8907) Patient did not have preoperative order for IV antibiotic SSI prophylaxis. (G8918)  

## 2012-05-26 NOTE — Patient Instructions (Addendum)
Two polyps were seen and removed. They look benign.  You do have hemorrhoids as we knew.  I will call you to follow-up about these and possible banding procedure that I might be able to offer you free if desired.  I will let you know pathology results and when to have another routine colonoscopy by mail.  Thank you for choosing me and [facility].  Iva Boop, MD, FACG   YOU HAD AN ENDOSCOPIC PROCEDURE TODAY AT THE [facility]: Refer to the procedure report that was given to you for any specific questions about what was found during the examination.  If the procedure report does not answer your questions, please call your gastroenterologist to clarify.  If you requested that your care partner not be given the details of your procedure findings, then the procedure report has been included in a sealed envelope for you to review at your convenience later.  YOU SHOULD EXPECT: Some feelings of bloating in the abdomen. Passage of more gas than usual.  Walking can help get rid of the air that was put into your GI tract during the procedure and reduce the bloating. If you had a lower endoscopy (such as a colonoscopy or flexible sigmoidoscopy) you may notice spotting of blood in your stool or on the toilet paper. If you underwent a bowel prep for your procedure, then you may not have a normal bowel movement for a few days.  DIET: Your first meal following the procedure should be a light meal and then it is ok to progress to your normal diet.  A half-sandwich or bowl of soup is an example of a good first meal.  Heavy or fried foods are harder to digest and may make you feel nauseous or bloated.  Likewise meals heavy in dairy and vegetables can cause extra gas to form and this can also increase the bloating.  Drink plenty of fluids but you should avoid alcoholic beverages for 24 hours.  ACTIVITY: Your care partner should take you home directly after the procedure.  You should  plan to take it easy, moving slowly for the rest of the day.  You can resume normal activity the day after the procedure however you should NOT DRIVE or use heavy machinery for 24 hours (because of the sedation medicines used during the test).    SYMPTOMS TO REPORT IMMEDIATELY: A gastroenterologist can be reached at any hour.  During normal business hours, 8:30 AM to 5:00 PM Monday through Friday, call [phone].  After hours and on weekends, please call the GI answering service at [phone] who will take a message and have the physician on call contact you.   Following lower endoscopy (colonoscopy or flexible sigmoidoscopy):  Excessive amounts of blood in the stool  Significant tenderness or worsening of abdominal pains  Swelling of the abdomen that is new, acute  Fever of 100F or higher     FOLLOW UP: If any biopsies were taken you will be contacted by phone or by letter within the next 1-3 weeks.  Call your gastroenterologist if you have not heard about the biopsies in 3 weeks.  Our staff will call the home number listed on your records the next business day following your procedure to check on you and address any questions or concerns that you may have at that time regarding the information given to you following your procedure. This is a courtesy call and so if there is no answer at the home  number and we have not heard from you through the emergency physician on call, we will assume that you have returned to your regular daily activities without incident.  SIGNATURES/CONFIDENTIALITY: You and/or your care partner have signed paperwork which will be entered into your electronic medical record.  These signatures attest to the fact that that the information above on your After Visit Summary has been reviewed and is understood.  Full responsibility of the confidentiality of this discharge information lies with you and/or your care-partner.

## 2012-05-26 NOTE — Progress Notes (Signed)
Stable to RR 

## 2012-05-26 NOTE — Progress Notes (Signed)
Called to room to assist during endoscopic procedure.  Patient ID and intended procedure confirmed with present staff. Received instructions for my participation in the procedure from the performing physician.  

## 2012-05-26 NOTE — Op Note (Signed)
[facility] [address] [place], [address]   COLONOSCOPY PROCEDURE REPORT  PATIENT: Jeffrey Harrington, Jeffrey Harrington  MR#: [id number] BIRTHDATE: Apr 12, 1960 , 52  yrs. old GENDER: Male ENDOSCOPIST: Iva Boop, MD, [facility] PROCEDURE DATE:  05/26/2012 PROCEDURE:   Colonoscopy with snare polypectomy ASA CLASS:   Class II INDICATIONS:average risk screening. MEDICATIONS: propofol (Diprivan) 250mg  IV, These medications were titrated to patient response per physician's verbal order, and MAC sedation, administered by CRNA  DESCRIPTION OF PROCEDURE:   After the risks benefits and alternatives of the procedure were thoroughly explained, informed consent was obtained.  A digital rectal exam revealed internal hemorrhoids, A digital rectal exam revealed no prostatic nodules, and A digital rectal exam revealed the prostate was not enlarged. The LB [id number]  endoscope was introduced through the anus and advanced to the cecum, which was identified by both the appendix and ileocecal valve. No adverse events experienced.   The quality of the prep was Suprep excellent  The instrument was then slowly withdrawn as the colon was fully examined.      COLON FINDINGS: Two polypoid shaped sessile polyps measuring 7-10 mm in size were found in the ascending colon.  A polypectomy was performed with a cold snare and using snare cautery.  The resection was complete and the polyp tissue was completely retrieved. Moderate sized internal hemorrhoids were found.   The colon mucosa was otherwise normal.  Retroflexed views revealed internal hemorrhoids. The time to cecum=2 minutes 09 seconds.  Withdrawal time=11 minutes 44 seconds.  The scope was withdrawn and the procedure completed. COMPLICATIONS: There were no complications.  ENDOSCOPIC IMPRESSION: 1.   Two sessile polyps measuring 7-10 mm in size were found in the ascending colon; polypectomy was performed with a cold snare and using snare  cautery 2.   Moderate sized internal hemorrhoids 3.   The colon mucosa was otherwise normal - excellent prep  RECOMMENDATIONS: 1.  Hold aspirin, aspirin products, and anti-inflammatory medication for 2 weeks. 2.  Timing of repeat colonoscopy will be determined by pathology findings. 3.   Consider hemorrhoidal ligation - will contact him  eSigned:  Iva Boop, MD, [facility] 05/26/2012 1:58 PM   cc: The Patient

## 2012-05-29 ENCOUNTER — Telehealth: Payer: Self-pay | Admitting: *Deleted

## 2012-05-29 NOTE — Telephone Encounter (Signed)
  Follow up Call-  Call back number 05/26/2012  Post procedure Call Back phone  # [phone]  Permission to leave phone message Yes     Patient questions:  Do you have a fever, pain , or abdominal swelling? no Pain Score  0 *  Have you tolerated food without any problems? yes  Have you been able to return to your normal activities? yes  Do you have any questions about your discharge instructions: Diet   no Medications  no Follow up visit  no  Do you have questions or concerns about your Care? no  Actions: * If pain score is 4 or above: No action needed, pain <4.

## 2012-06-07 ENCOUNTER — Encounter: Payer: Self-pay | Admitting: Internal Medicine

## 2012-06-07 DIAGNOSIS — Z860100 Personal history of colon polyps, unspecified: Secondary | ICD-10-CM

## 2012-06-07 HISTORY — DX: Personal history of colonic polyps: Z86.010

## 2012-06-07 HISTORY — DX: Personal history of colon polyps, unspecified: Z86.0100

## 2012-06-07 NOTE — Progress Notes (Signed)
Quick Note:  2 adenomas max 10 mm Repeat colonoscopy 05/2015 ______

## 2012-06-12 ENCOUNTER — Ambulatory Visit: Payer: 59 | Admitting: Internal Medicine

## 2012-08-08 ENCOUNTER — Telehealth: Payer: Self-pay | Admitting: *Deleted

## 2012-08-08 ENCOUNTER — Telehealth: Payer: Self-pay | Admitting: Internal Medicine

## 2012-08-08 NOTE — Telephone Encounter (Signed)
Patient wanted to know if he would be "put to sleep" for his procedure.   I called him back and told him, "Yes."   He said that he would be here tomorrow.  I told him to call us if he had any more questions.

## 2012-08-08 NOTE — Telephone Encounter (Signed)
Patient advised that he will not be put to sleep for hemorrhoid banding tomorrow.  He will call back if he has any questions or concerns

## 2012-08-09 ENCOUNTER — Ambulatory Visit (INDEPENDENT_AMBULATORY_CARE_PROVIDER_SITE_OTHER): Payer: BC Managed Care – PPO | Admitting: Internal Medicine

## 2012-08-09 VITALS — BP 138/72 | HR 95 | Ht 70.0 in | Wt 197.0 lb

## 2012-08-09 DIAGNOSIS — K648 Other hemorrhoids: Secondary | ICD-10-CM

## 2012-08-09 NOTE — Progress Notes (Signed)
PROCEDURE NOTE: The patient presents with symptomatic grade 2 hemorrhoids, unresponsive to maximal medical therapy, requesting rubber band ligation of his/her hemorrhoidal disease.  All risks, benefits and alternative forms of therapy were described and informed consent was obtained.  Anoscopy revealed Grade 2 right posterior and anterior hemorrhoids. The decision was made to band the RP internal hemorrhoid, and the [facility] O'Regan System was used to perform band ligation without complication.  Digital anorectal examination was then performed to assure proper positioning of the band, and to adjust the banded tissue as required.  The patient was discharged home without pain or other issues.  Dietary and behavioral recommendations were given along with follow-up instructions.  The patient will return  2 weeks or as needed for follow-up and possible additional banding as required. No complications were encountered and the patient tolerated the procedure well.

## 2012-08-09 NOTE — Patient Instructions (Addendum)
HEMORRHOID BANDING PROCEDURE    FOLLOW-UP CARE   1. The procedure you have had should have been relatively painless since the banding of the area involved does not have nerve endings and there is no pain sensation.  The rubber band cuts off the blood supply to the hemorrhoid and the band may fall off as soon as 48 hours after the banding (the band may occasionally be seen in the toilet bowl following a bowel movement). You may notice a temporary feeling of fullness in the rectum which should respond adequately to plain Tylenol or Motrin.  2. Following the banding, avoid strenuous exercise that evening and resume full activity the next day.  A sitz bath (soaking in a warm tub) or bidet is soothing, and can be useful for cleansing the area after bowel movements.     3. To avoid constipation, take two tablespoons of natural wheat bran, natural oat bran, flax, Benefiber or any over the counter fiber supplement and increase your water intake to 7-8 glasses daily.    4. Unless you have been prescribed anorectal medication, do not put anything inside your rectum for two weeks: No suppositories, enemas, fingers, etc.  5. Occasionally, you may have more bleeding than usual after the banding procedure.  This is often from the untreated hemorrhoids rather than the treated one.  Don't be concerned if there is a tablespoon or so of blood.  If there is more blood than this, lie flat with your bottom higher than your head and apply an ice pack to the area. If the bleeding does not stop within a half an hour or if you feel faint, call our office at [phone] or go to the emergency room.  6. Problems are not common; however, if there is a substantial amount of bleeding, severe pain, chills, fever or difficulty passing urine (very rare) or other problems, you should call us at [phone] or report to the nearest emergency room.  7. Do not stay seated continuously for more than 2-3 hours for a day or two  after the procedure.  Tighten your buttock muscles 10-15 times every two hours and take 10-15 deep breaths every 1-2 hours.  Do not spend more than a few minutes on the toilet if you cannot empty your bowel; instead re-visit the toilet at a later time.    Long Term Prevention of Recurrent Hemorrhoids:   1. Fiber - Western diets are typically deficient in dietary fiber, and the addition of 15 - 20 gm. of fiber will help you have stools of a proper consistency, limiting your need to strain.  In addition to the use of raw oat or wheat bran, there are a number of commercial preparations that are available (Metamucil, Benefiber and Citrucel are just a few).    2. Fluids - It is important to have a sufficient amount of water intake during the day, in part to help the fiber "do its job".  Unless you have a medical condition that would prohibit it, a minimum of 6 - 8 glasses per day is important to help keep a regular bowel movement.  3. Do not strain - Many experts feel that chronic straining is one of the causes for the development of hemorrhoids.  Trying to limit yourself to two minutes on the commode may well limit your risk of recurrent hemorrhoids.  Also, do not try to "hold it" or avoid going to the bathroom when the urge is there.  These behavioral  changes are thought to be very helpful in maintaining good bowel health.  You were given a fiber supplement sheet

## 2012-08-30 ENCOUNTER — Encounter: Payer: BC Managed Care – PPO | Admitting: Internal Medicine

## 2012-08-31 ENCOUNTER — Ambulatory Visit (INDEPENDENT_AMBULATORY_CARE_PROVIDER_SITE_OTHER): Payer: BC Managed Care – PPO | Admitting: Internal Medicine

## 2012-08-31 VITALS — BP 120/68 | HR 88 | Ht 71.0 in | Wt 198.0 lb

## 2012-08-31 DIAGNOSIS — K649 Unspecified hemorrhoids: Secondary | ICD-10-CM

## 2012-08-31 DIAGNOSIS — K648 Other hemorrhoids: Secondary | ICD-10-CM

## 2012-08-31 NOTE — Progress Notes (Signed)
Patient ID: Jeffrey Harrington, male   DOB: September 23, 1960, 52 y.o.   MRN: [id number]  PROCEDURE NOTE: The patient presents with symptomatic grade 2hemorrhoids, unresponsive to maximal medical therapy, requesting rubber band ligation of his/her hemorrhoidal disease.  All risks, benefits and alternative forms of therapy were described and informed consent was obtained. He is s/p prior ligation of the RP column.  He has had less bleeding since that ligation.   In the Left Lateral Decubitus position the decision was made to band the RA internal hemorrhoid, and the [facility] O'Regan System was used to perform band ligation without complication.  Digital anorectal examination was then performed to assure proper positioning of the band, and to adjust the banded tissue as required.  The patient was discharged home without pain or other issues.  Dietary and behavioral recommendations were given and (if necessary - prescriptions were given), along with follow-up instructions.  The patient will return in 3-4 weeks for follow-up and possible additional banding as required. Anticipate banding the LL column next. No complications were encountered and the patient tolerated the procedure well.

## 2012-08-31 NOTE — Patient Instructions (Addendum)
HEMORRHOID BANDING PROCEDURE    FOLLOW-UP CARE   1. The procedure you have had should have been relatively painless since the banding of the area involved does not have nerve endings and there is no pain sensation.  The rubber band cuts off the blood supply to the hemorrhoid and the band may fall off as soon as 48 hours after the banding (the band may occasionally be seen in the toilet bowl following a bowel movement). You may notice a temporary feeling of fullness in the rectum which should respond adequately to plain Tylenol or Motrin.  2. Following the banding, avoid strenuous exercise that evening and resume full activity the next day.  A sitz bath (soaking in a warm tub) or bidet is soothing, and can be useful for cleansing the area after bowel movements.     3. To avoid constipation, take two tablespoons of natural wheat bran, natural oat bran, flax, Benefiber or any over the counter fiber supplement and increase your water intake to 7-8 glasses daily.    4. Unless you have been prescribed anorectal medication, do not put anything inside your rectum for two weeks: No suppositories, enemas, fingers, etc.  5. Occasionally, you may have more bleeding than usual after the banding procedure.  This is often from the untreated hemorrhoids rather than the treated one.  Don't be concerned if there is a tablespoon or so of blood.  If there is more blood than this, lie flat with your bottom higher than your head and apply an ice pack to the area. If the bleeding does not stop within a half an hour or if you feel faint, call our office at [phone] or go to the emergency room.  6. Problems are not common; however, if there is a substantial amount of bleeding, severe pain, chills, fever or difficulty passing urine (very rare) or other problems, you should call us at [phone] or report to the nearest emergency room.  7. Do not stay seated continuously for more than 2-3 hours for a day or two  after the procedure.  Tighten your buttock muscles 10-15 times every two hours and take 10-15 deep breaths every 1-2 hours.  Do not spend more than a few minutes on the toilet if you cannot empty your bowel; instead re-visit the toilet at a later time.      I appreciate the opportunity to care for you.         

## 2012-09-11 ENCOUNTER — Telehealth: Payer: Self-pay | Admitting: Internal Medicine

## 2012-09-11 NOTE — Telephone Encounter (Signed)
Patient asked me to cancel banding appt

## 2012-09-18 ENCOUNTER — Encounter: Payer: BC Managed Care – PPO | Admitting: Internal Medicine

## 2012-09-26 ENCOUNTER — Encounter: Payer: BC Managed Care – PPO | Admitting: Internal Medicine

## 2013-02-07 ENCOUNTER — Ambulatory Visit (INDEPENDENT_AMBULATORY_CARE_PROVIDER_SITE_OTHER): Payer: 59 | Admitting: Physician Assistant

## 2013-02-07 VITALS — BP 142/76 | HR 96 | Temp 99.5°F | Resp 18 | Ht 69.0 in | Wt 201.6 lb

## 2013-02-07 DIAGNOSIS — I10 Essential (primary) hypertension: Secondary | ICD-10-CM

## 2013-02-07 MED ORDER — LOSARTAN POTASSIUM-HCTZ 50-12.5 MG PO TABS: 1.0000 | ORAL_TABLET | Freq: Every day | ORAL | Status: DC

## 2013-02-07 MED ORDER — AMLODIPINE BESYLATE 10 MG PO TABS: 10.0000 mg | ORAL_TABLET | Freq: Every day | ORAL | Status: DC

## 2013-02-07 NOTE — Progress Notes (Signed)
  Subjective:    Patient ID: Jeffrey Harrington, male    DOB: 1960/11/23, 52 y.o.   MRN: [id number]  HPI   Mr. Cazarez is a very pleasant 52 yr old male here for refills of hypertension medication.  Took his last pills today.  He is on amlodipine and Hyzaar.  Has been on these medicine for about a year or so now.  He tolerates them well.  Does not check home BPs.  Denies CP, SOB, dizziness, edema.   He does not have a PCP.  Usually [facility] [address].  Thinks his last CPE was about a year ago.    He shares that he stopped smoking 5 days ago!!  He is using e-cigarettes but not sure if these are necessarily better.  He hopes to quit for good.    Review of Systems  Constitutional: Negative for fever and chills.  Respiratory: Negative for cough, shortness of breath and wheezing.   Cardiovascular: Negative for chest pain, palpitations and leg swelling.  Gastrointestinal: Negative.   Musculoskeletal: Negative.   Skin: Negative.   Neurological: Negative.        Objective:   Physical Exam  Vitals reviewed. Constitutional: He is oriented to person, place, and time. He appears well-developed and well-nourished. No distress.  HENT:  Head: Normocephalic and atraumatic.  Eyes: Conjunctivae are normal. No scleral icterus.  Cardiovascular: Normal rate, regular rhythm and normal heart sounds.   Pulmonary/Chest: Effort normal and breath sounds normal. He has no wheezes. He has no rales.  Abdominal: Soft. There is no tenderness.  Neurological: He is alert and oriented to person, place, and time.  Skin: Skin is warm and dry.  Psychiatric: He has a normal mood and affect. His behavior is normal.        Assessment & Plan:  Hypertension - Plan: amLODipine (NORVASC) 10 MG tablet, losartan-hydrochlorothiazide (HYZAAR) 50-12.5 MG per tablet, Basic metabolic panel  Mr. Barradas is a very pleasant 52 yr old male here for refills on HTN medication.  His BP appears to be well controlled.  BMP pending.   Meds refilled.  Encouraged him to check home BPs 1-2 times per week.  Encouraged him to schedule CPE in 6-12 months.  Congratulated him on his efforts at smoking cessation!  Discussed e-cigs use and that the best choice is to discontinue all tobacco/nicotine products completed.  Encouraged him in his efforts.   Meds ordered this encounter  Medications  . amLODipine (NORVASC) 10 MG tablet    Sig: Take 1 tablet (10 mg total) by mouth daily.    Dispense:  90 tablet    Refill:  1    Order Specific Question:  Supervising Provider    Answer:  Ethelda Chick [[id number]]  . losartan-hydrochlorothiazide (HYZAAR) 50-12.5 MG per tablet    Sig: Take 1 tablet by mouth daily.    Dispense:  90 tablet    Refill:  1    Order Specific Question:  Supervising Provider    Answer:  Ethelda Chick [[id number]]    Loleta Dicker MHS, PA-C [facility] 11/19/20147:47 PM

## 2013-02-07 NOTE — Patient Instructions (Signed)
Continue taking your blood pressure medicines as directed.  Check your blood pressure 1-2 times per week if possible.  If you are seeing top numbers >140 or bottom numbers >90 then we may need to adjust your medications.  Plan to come in (or schedule an appt) for a CPE in the next 6-12 months so we can make sure you are up to date on health maintenance.  Keep up the good work on quitting smoking!! :)  Hypertension As your heart beats, it forces blood through your arteries. This force is your blood pressure. If the pressure is too high, it is called hypertension (HTN) or high blood pressure. HTN is dangerous because you may have it and not know it. High blood pressure may mean that your heart has to work harder to pump blood. Your arteries may be narrow or stiff. The extra work puts you at risk for heart disease, stroke, and other problems.  Blood pressure consists of two numbers, a higher number over a lower, 110/72, for example. It is stated as "110 over 72." The ideal is below 120 for the top number (systolic) and under 80 for the bottom (diastolic). Write down your blood pressure today. You should pay close attention to your blood pressure if you have certain conditions such as:  Heart failure.  Prior heart attack.  Diabetes  Chronic kidney disease.  Prior stroke.  Multiple risk factors for heart disease. To see if you have HTN, your blood pressure should be measured while you are seated with your arm held at the level of the heart. It should be measured at least twice. A one-time elevated blood pressure reading (especially in the Emergency Department) does not mean that you need treatment. There may be conditions in which the blood pressure is different between your right and left arms. It is important to see your caregiver soon for a recheck. Most people have essential hypertension which means that there is not a specific cause. This type of high blood pressure may be lowered by changing  lifestyle factors such as:  Stress.  Smoking.  Lack of exercise.  Excessive weight.  Drug/tobacco/alcohol use.  Eating less salt. Most people do not have symptoms from high blood pressure until it has caused damage to the body. Effective treatment can often prevent, delay or reduce that damage. TREATMENT  When a cause has been identified, treatment for high blood pressure is directed at the cause. There are a large number of medications to treat HTN. These fall into several categories, and your caregiver will help you select the medicines that are best for you. Medications may have side effects. You should review side effects with your caregiver. If your blood pressure stays high after you have made lifestyle changes or started on medicines,   Your medication(s) may need to be changed.  Other problems may need to be addressed.  Be certain you understand your prescriptions, and know how and when to take your medicine.  Be sure to follow up with your caregiver within the time frame advised (usually within two weeks) to have your blood pressure rechecked and to review your medications.  If you are taking more than one medicine to lower your blood pressure, make sure you know how and at what times they should be taken. Taking two medicines at the same time can result in blood pressure that is too low. SEEK IMMEDIATE MEDICAL CARE IF:  You develop a severe headache, blurred or changing vision, or confusion.  You  have unusual weakness or numbness, or a faint feeling.  You have severe chest or abdominal pain, vomiting, or breathing problems. MAKE SURE YOU:   Understand these instructions.  Will watch your condition.  Will get help right away if you are not doing well or get worse. Document Released: 03/08/2005 Document Revised: 05/31/2011 Document Reviewed: 10/27/2007 [facility] Patient Information 2014 [place], [place].

## 2013-02-08 LAB — BASIC METABOLIC PANEL
CO2: 27 mEq/L (ref 19–32)
Calcium: 9.8 mg/dL (ref 8.4–10.5)
Creat: 1.06 mg/dL (ref 0.50–1.35)
Glucose, Bld: 88 mg/dL (ref 70–99)

## 2013-02-11 ENCOUNTER — Encounter: Payer: Self-pay | Admitting: Radiology

## 2013-08-25 ENCOUNTER — Other Ambulatory Visit: Payer: Self-pay | Admitting: Physician Assistant

## 2013-10-02 ENCOUNTER — Ambulatory Visit (INDEPENDENT_AMBULATORY_CARE_PROVIDER_SITE_OTHER): Payer: 59 | Admitting: Internal Medicine

## 2013-10-02 ENCOUNTER — Ambulatory Visit (INDEPENDENT_AMBULATORY_CARE_PROVIDER_SITE_OTHER): Payer: 59

## 2013-10-02 VITALS — BP 140/82 | HR 74 | Temp 98.6°F | Resp 17 | Ht 70.5 in | Wt 204.0 lb

## 2013-10-02 DIAGNOSIS — Z79899 Other long term (current) drug therapy: Secondary | ICD-10-CM

## 2013-10-02 DIAGNOSIS — I10 Essential (primary) hypertension: Secondary | ICD-10-CM

## 2013-10-02 DIAGNOSIS — F17200 Nicotine dependence, unspecified, uncomplicated: Secondary | ICD-10-CM

## 2013-10-02 DIAGNOSIS — R0689 Other abnormalities of breathing: Secondary | ICD-10-CM

## 2013-10-02 DIAGNOSIS — R0989 Other specified symptoms and signs involving the circulatory and respiratory systems: Secondary | ICD-10-CM

## 2013-10-02 MED ORDER — AMLODIPINE BESYLATE 10 MG PO TABS: 10.0000 mg | ORAL_TABLET | Freq: Every day | ORAL | Status: DC

## 2013-10-02 MED ORDER — LOSARTAN POTASSIUM-HCTZ 50-12.5 MG PO TABS: 1.0000 | ORAL_TABLET | Freq: Every day | ORAL | Status: DC

## 2013-10-02 NOTE — Patient Instructions (Signed)
Hypertension Hypertension, commonly called high blood pressure, is when the force of blood pumping through your arteries is too strong. Your arteries are the blood vessels that carry blood from your heart throughout your body. A blood pressure reading consists of a higher number over a lower number, such as 110/72. The higher number (systolic) is the pressure inside your arteries when your heart pumps. The lower number (diastolic) is the pressure inside your arteries when your heart relaxes. Ideally you want your blood pressure below 120/80. Hypertension forces your heart to work harder to pump blood. Your arteries may become narrow or stiff. Having hypertension puts you at risk for heart disease, stroke, and other problems.  RISK FACTORS Some risk factors for high blood pressure are controllable. Others are not.  Risk factors you cannot control include:   Race. You may be at higher risk if you are African American.  Age. Risk increases with age.  Gender. Men are at higher risk than women before age 45 years. After age 65, women are at higher risk than men. Risk factors you can control include:  Not getting enough exercise or physical activity.  Being overweight.  Getting too much fat, sugar, calories, or salt in your diet.  Drinking too much alcohol. SIGNS AND SYMPTOMS Hypertension does not usually cause signs or symptoms. Extremely high blood pressure (hypertensive crisis) may cause headache, anxiety, shortness of breath, and nosebleed. DIAGNOSIS  To check if you have hypertension, your health care provider will measure your blood pressure while you are seated, with your arm held at the level of your heart. It should be measured at least twice using the same arm. Certain conditions can cause a difference in blood pressure between your right and left arms. A blood pressure reading that is higher than normal on one occasion does not mean that you need treatment. If one blood pressure reading  is high, ask your health care provider about having it checked again. TREATMENT  Treating high blood pressure includes making lifestyle changes and possibly taking medication. Living a healthy lifestyle can help lower high blood pressure. You may need to change some of your habits. Lifestyle changes may include:  Following the DASH diet. This diet is high in fruits, vegetables, and whole grains. It is low in salt, red meat, and added sugars.  Getting at least 2 1/2 hours of brisk physical activity every week.  Losing weight if necessary.  Not smoking.  Limiting alcoholic beverages.  Learning ways to reduce stress. If lifestyle changes are not enough to get your blood pressure under control, your health care provider may prescribe medicine. You may need to take more than one. Work closely with your health care provider to understand the risks and benefits. HOME CARE INSTRUCTIONS  Have your blood pressure rechecked as directed by your health care provider.   Only take medicine as directed by your health care provider. Follow the directions carefully. Blood pressure medicines must be taken as prescribed. The medicine does not work as well when you skip doses. Skipping doses also puts you at risk for problems.   Do not smoke.   Monitor your blood pressure at home as directed by your health care provider. SEEK MEDICAL CARE IF:   You think you are having a reaction to medicines taken.  You have recurrent headaches or feel dizzy.  You have swelling in your ankles.  You have trouble with your vision. SEEK IMMEDIATE MEDICAL CARE IF:  You develop a severe headache or   confusion.  You have unusual weakness, numbness, or feel faint.  You have severe chest or abdominal pain.  You vomit repeatedly.  You have trouble breathing. MAKE SURE YOU:   Understand these instructions.  Will watch your condition.  Will get help right away if you are not doing well or get  worse. Document Released: 03/08/2005 Document Revised: 03/13/2013 Document Reviewed: 12/29/2012 [facility] Patient Information 2015 [place], [place]. This information is not intended to replace advice given to you by your health care provider. Make sure you discuss any questions you have with your health care provider. Smoking Cessation Quitting smoking is important to your health and has many advantages. However, it is not always easy to quit since nicotine is a very addictive drug. Often times, people try 3 times or more before being able to quit. This document explains the best ways for you to prepare to quit smoking. Quitting takes hard work and a lot of effort, but you can do it. ADVANTAGES OF QUITTING SMOKING  You will live longer, feel better, and live better.  Your body will feel the impact of quitting smoking almost immediately.  Within 20 minutes, blood pressure decreases. Your pulse returns to its normal level.  After 8 hours, carbon monoxide levels in the blood return to normal. Your oxygen level increases.  After 24 hours, the chance of having a heart attack starts to decrease. Your breath, hair, and body stop smelling like smoke.  After 48 hours, damaged nerve endings begin to recover. Your sense of taste and smell improve.  After 72 hours, the body is virtually free of nicotine. Your bronchial tubes relax and breathing becomes easier.  After 2 to 12 weeks, lungs can hold more air. Exercise becomes easier and circulation improves.  The risk of having a heart attack, stroke, cancer, or lung disease is greatly reduced.  After 1 year, the risk of coronary heart disease is cut in half.  After 5 years, the risk of stroke falls to the same as a nonsmoker.  After 10 years, the risk of lung cancer is cut in half and the risk of other cancers decreases significantly.  After 15 years, the risk of coronary heart disease drops, usually to the level of a nonsmoker.  If you are  pregnant, quitting smoking will improve your chances of having a healthy baby.  The people you live with, especially any children, will be healthier.  You will have extra money to spend on things other than cigarettes. QUESTIONS TO THINK ABOUT BEFORE ATTEMPTING TO QUIT You may want to talk about your answers with your caregiver.  Why do you want to quit?  If you tried to quit in the past, what helped and what did not?  What will be the most difficult situations for you after you quit? How will you plan to handle them?  Who can help you through the tough times? Your family? Friends? A caregiver?  What pleasures do you get from smoking? What ways can you still get pleasure if you quit? Here are some questions to ask your caregiver:  How can you help me to be successful at quitting?  What medicine do you think would be best for me and how should I take it?  What should I do if I need more help?  What is smoking withdrawal like? How can I get information on withdrawal? GET READY  Set a quit date.  Change your environment by getting rid of all cigarettes, ashtrays, matches, and lighters  in your home, car, or work. Do not let people smoke in your home.  Review your past attempts to quit. Think about what worked and what did not. GET SUPPORT AND ENCOURAGEMENT You have a better chance of being successful if you have help. You can get support in many ways.  Tell your family, friends, and co-workers that you are going to quit and need their support. Ask them not to smoke around you.  Get individual, group, or telephone counseling and support. Programs are available at [employer] and health centers. Call your local health department for information about programs in your area.  Spiritual beliefs and practices may help some smokers quit.  Download a "quit meter" on your computer to keep track of quit statistics, such as how long you have gone without smoking, cigarettes not smoked,  and money saved.  Get a self-help book about quitting smoking and staying off of tobacco. Oxford yourself from urges to smoke. Talk to someone, go for a walk, or occupy your time with a task.  Change your normal routine. Take a different route to work. Drink tea instead of coffee. Eat breakfast in a different place.  Reduce your stress. Take a hot bath, exercise, or read a book.  Plan something enjoyable to do every day. Reward yourself for not smoking.  Explore interactive web-based programs that specialize in helping you quit. GET MEDICINE AND USE IT CORRECTLY Medicines can help you stop smoking and decrease the urge to smoke. Combining medicine with the above behavioral methods and support can greatly increase your chances of successfully quitting smoking.  Nicotine replacement therapy helps deliver nicotine to your body without the negative effects and risks of smoking. Nicotine replacement therapy includes nicotine gum, lozenges, inhalers, nasal sprays, and skin patches. Some may be available over-the-counter and others require a prescription.  Antidepressant medicine helps people abstain from smoking, but how this works is unknown. This medicine is available by prescription.  Nicotinic receptor partial agonist medicine simulates the effect of nicotine in your brain. This medicine is available by prescription. Ask your caregiver for advice about which medicines to use and how to use them based on your health history. Your caregiver will tell you what side effects to look out for if you choose to be on a medicine or therapy. Carefully read the information on the package. Do not use any other product containing nicotine while using a nicotine replacement product.  RELAPSE OR DIFFICULT SITUATIONS Most relapses occur within the first 3 months after quitting. Do not be discouraged if you start smoking again. Remember, most people try several times before  finally quitting. You may have symptoms of withdrawal because your body is used to nicotine. You may crave cigarettes, be irritable, feel very hungry, cough often, get headaches, or have difficulty concentrating. The withdrawal symptoms are only temporary. They are strongest when you first quit, but they will go away within 10-14 days. To reduce the chances of relapse, try to:  Avoid drinking alcohol. Drinking lowers your chances of successfully quitting.  Reduce the amount of caffeine you consume. Once you quit smoking, the amount of caffeine in your body increases and can give you symptoms, such as a rapid heartbeat, sweating, and anxiety.  Avoid smokers because they can make you want to smoke.  Do not let weight gain distract you. Many smokers will gain weight when they quit, usually less than 10 pounds. Eat a healthy diet and stay active.  You can always lose the weight gained after you quit.  Find ways to improve your mood other than smoking. FOR MORE INFORMATION  www.smokefree.gov  Document Released: 03/02/2001 Document Revised: 09/07/2011 Document Reviewed: 06/17/2011 [facility] Patient Information 2015 [place], [place]. This information is not intended to replace advice given to you by your health care provider. Make sure you discuss any questions you have with your health care provider.

## 2013-10-02 NOTE — Progress Notes (Signed)
   Subjective:    Patient ID: Jeffrey Harrington, male    DOB: 09/14/1960, 53 y.o.   MRN: [id number]  HPIPt here today to renew his BP medication.  He took his last amlodipine 10mg  tab and his last losartan-hctz 50-12.5 tab today.  He states his BP is usually around 140/90 when he checks it at the pharmacy.  Patient denies any dizziness, headaches, palpitations, SOB, or chest pain.   He is smoker, has no primary care and needs cpe soon.   Review of Systems     Objective:   Physical Exam  Constitutional: He is oriented to person, place, and time. He appears well-developed and well-nourished. No distress.  HENT:  Head: Normocephalic.  Nose: Nose normal.  Eyes: EOM are normal. Pupils are equal, round, and reactive to light.  Cardiovascular: Normal rate, regular rhythm and normal heart sounds.   Pulmonary/Chest: Not tachypneic. No respiratory distress. He has decreased breath sounds. He has wheezes. He has rhonchi. He has rales. He exhibits no tenderness.  Lymphadenopathy:    He has no cervical adenopathy.  Neurological: He is alert and oriented to person, place, and time. He exhibits normal muscle tone. Coordination normal.  Psychiatric: He has a normal mood and affect. His behavior is normal.    Labs [facility] reading (PRIMARY) by  Dr.Kameka Whan cxr clear        Assessment & Plan:  Smoker/abnormal breath sounds/Quit smoking HTN RF meds Schedule cpe soon 104

## 2013-10-03 LAB — COMPREHENSIVE METABOLIC PANEL
ALBUMIN: 4.6 g/dL (ref 3.5–5.2)
ALT: 24 U/L (ref 0–53)
AST: 29 U/L (ref 0–37)
Alkaline Phosphatase: 47 U/L (ref 39–117)
BUN: 14 mg/dL (ref 6–23)
CHLORIDE: 100 meq/L (ref 96–112)
CO2: 28 mEq/L (ref 19–32)
CREATININE: 0.88 mg/dL (ref 0.50–1.35)
Calcium: 9.4 mg/dL (ref 8.4–10.5)
Glucose, Bld: 91 mg/dL (ref 70–99)
Potassium: 4.2 mEq/L (ref 3.5–5.3)
Sodium: 138 mEq/L (ref 135–145)
TOTAL PROTEIN: 7.4 g/dL (ref 6.0–8.3)
Total Bilirubin: 0.3 mg/dL (ref 0.2–1.2)

## 2013-10-03 LAB — CBC
HCT: 40.8 % (ref 39.0–52.0)
Hemoglobin: 14 g/dL (ref 13.0–17.0)
MCH: 26.9 pg (ref 26.0–34.0)
MCHC: 34.3 g/dL (ref 30.0–36.0)
MCV: 78.5 fL (ref 78.0–100.0)
Platelets: 285 10*3/uL (ref 150–400)
RBC: 5.2 MIL/uL (ref 4.22–5.81)
RDW: 15.5 % (ref 11.5–15.5)
WBC: 4.9 10*3/uL (ref 4.0–10.5)

## 2013-10-03 LAB — LIPID PANEL
CHOL/HDL RATIO: 2.4 ratio
Cholesterol: 183 mg/dL (ref 0–200)
HDL: 75 mg/dL (ref >39)
LDL Cholesterol: 86 mg/dL (ref 0–99)
Triglycerides: 110 mg/dL (ref <150)
VLDL: 22 mg/dL (ref 0–40)

## 2013-10-03 LAB — TSH: TSH: 2.609 u[IU]/mL (ref 0.350–4.500)

## 2013-10-04 LAB — PSA: PSA: 6.59 ng/mL — AB (ref <=4.00)

## 2013-10-05 ENCOUNTER — Encounter: Payer: Self-pay | Admitting: Radiology

## 2013-12-28 ENCOUNTER — Other Ambulatory Visit: Payer: Self-pay | Admitting: Internal Medicine

## 2014-02-23 ENCOUNTER — Other Ambulatory Visit: Payer: Self-pay | Admitting: Internal Medicine

## 2014-06-11 ENCOUNTER — Other Ambulatory Visit: Payer: Self-pay | Admitting: Internal Medicine

## 2014-07-26 ENCOUNTER — Ambulatory Visit (INDEPENDENT_AMBULATORY_CARE_PROVIDER_SITE_OTHER): Payer: 59 | Admitting: Family Medicine

## 2014-07-26 VITALS — BP 142/84 | HR 86 | Temp 98.3°F | Resp 18 | Ht 69.5 in | Wt 204.2 lb

## 2014-07-26 DIAGNOSIS — R972 Elevated prostate specific antigen [PSA]: Secondary | ICD-10-CM

## 2014-07-26 DIAGNOSIS — I10 Essential (primary) hypertension: Secondary | ICD-10-CM

## 2014-07-26 MED ORDER — LOSARTAN POTASSIUM-HCTZ 50-12.5 MG PO TABS: 1.0000 | ORAL_TABLET | Freq: Every day | ORAL | Status: DC

## 2014-07-26 MED ORDER — AMLODIPINE BESYLATE 10 MG PO TABS: 10.0000 mg | ORAL_TABLET | Freq: Every day | ORAL | Status: DC

## 2014-07-26 NOTE — Progress Notes (Addendum)
[facility] [address], [place] [address] [phone]  Date:  07/26/2014   Name:  Jeffrey Harrington   DOB:  [birth year]   MRN:  [id number]  PCP:  No PCP Per Patient    Chief Complaint: Medication Refill   History of Present Illness:  Jeffrey Harrington is a 54 y.o. very pleasant male patient who presents with the following:  History of HTN, here today seeking medication refill.  Last labs were this past July.   He last ate about 5 hours ago.   Feeling well- did not end up getting his elevated PSA rechecked last year   He has been out of his meds for about 4 days  BP Readings from Last 3 Encounters:  07/26/14 142/84  10/02/13 140/82  02/07/13 142/76     Patient Active Problem List   Diagnosis Date Noted  . HTN (hypertension) 10/02/2013  . Personal history of colonic adenomas 06/07/2012    Past Medical History  Diagnosis Date  . Hypertension   . Syncope 2008  . Personal history of colonic adenomas 06/07/2012    05/2012 - 2 adenomas max 10 mm    Past Surgical History  Procedure Laterality Date  . Hemorrhoid surgery    . Inguinal hernia repair Right     History  Substance Use Topics  . Smoking status: Current Every Day Smoker -- 0.50 packs/day for 39 years    Types: Cigarettes  . Smokeless tobacco: Never Used  . Alcohol Use: 1.5 oz/week    3 drink(s) per week     Comment: 2 per day    Family History  Problem Relation Age of Onset  . Diabetes Mother   . Lung cancer Father   . Throat cancer Brother     ?    No Known Allergies  Medication list has been reviewed and updated.  Current Outpatient Prescriptions on File Prior to Visit  Medication Sig Dispense Refill  . amLODipine (NORVASC) 10 MG tablet Take 1 tablet (10 mg total) by mouth daily. NO MORE REFILLS WITHOUT OFFICE VISIT - 2ND NOTICE 15 tablet 0  . losartan-hydrochlorothiazide (HYZAAR) 50-12.5 MG per tablet Take 1 tablet by mouth daily. NO MORE REFILLS WITHOUT OFFICE VISIT -  2ND NOTICE 15 tablet 0   No current facility-administered medications on file prior to visit.    Review of Systems:  As per HPI- otherwise negative.   Physical Examination: Filed Vitals:   07/26/14 1746  BP: 142/84  Pulse: 86  Temp: 98.3 F (36.8 C)  Resp: 18   Filed Vitals:   07/26/14 1746  Height: 5' 9.5" (1.765 m)  Weight: 204 lb 3.2 oz (92.625 kg)   Body mass index is 29.73 kg/(m^2). Ideal Body Weight: Weight in (lb) to have BMI = 25: 171.4  GEN: WDWN, NAD, Non-toxic, A & O x 3, looks well HEENT: Atraumatic, Normocephalic. Neck supple. No masses, No LAD. Ears and Nose: No external deformity. CV: RRR, No M/G/R. No JVD. No thrill. No extra heart sounds. PULM: CTA B, no wheezes, crackles, rhonchi. No retractions. No resp. distress. No accessory muscle use. EXTR: No c/c/e NEURO Normal gait.  PSYCH: Normally interactive. Conversant. Not depressed or anxious appearing.  Calm demeanor.    Assessment and Plan: Essential hypertension - Plan: amLODipine (NORVASC) 10 MG tablet, losartan-hydrochlorothiazide (HYZAAR) 50-12.5 MG per tablet, Comprehensive metabolic panel  Elevated PSA - Plan: PSA  Refilled his BP medications and advised regarding BP goals Check PSA-  if still high will need to see urology  See patient instructions for more details.     Signed Lamar Blinks, MD  Received his labs 5/9.  Called and reached pt but could not hear due to poor connection Called back 5/10 and LMOM.  PSA is better but still high, will refer to urology for opinion.  OW will mail him a copy of his labs   Results for orders placed or performed in visit on 07/26/14  Comprehensive metabolic panel  Result Value Ref Range   Sodium 137 135 - 145 mEq/L   Potassium 4.0 3.5 - 5.3 mEq/L   Chloride 99 96 - 112 mEq/L   CO2 28 19 - 32 mEq/L   Glucose, Bld 96 70 - 99 mg/dL   BUN 16 6 - 23 mg/dL   Creat 0.96 0.50 - 1.35 mg/dL   Total Bilirubin 0.5 0.2 - 1.2 mg/dL   Alkaline Phosphatase  48 39 - 117 U/L   AST 56 (H) 0 - 37 U/L   ALT 46 0 - 53 U/L   Total Protein 7.9 6.0 - 8.3 g/dL   Albumin 4.8 3.5 - 5.2 g/dL   Calcium 9.5 8.4 - 10.5 mg/dL  PSA  Result Value Ref Range   PSA 5.40 (H) <=4.00 ng/mL

## 2014-07-26 NOTE — Patient Instructions (Signed)
I will be in touch with your labs It would be smart to get a home cuff and keep an eye one your blood pressure; we would like for you to be approx 130/85 or below Please start back on your blood pressure medications

## 2014-07-27 LAB — COMPREHENSIVE METABOLIC PANEL
ALBUMIN: 4.8 g/dL (ref 3.5–5.2)
ALK PHOS: 48 U/L (ref 39–117)
ALT: 46 U/L (ref 0–53)
AST: 56 U/L — AB (ref 0–37)
BILIRUBIN TOTAL: 0.5 mg/dL (ref 0.2–1.2)
BUN: 16 mg/dL (ref 6–23)
CO2: 28 mEq/L (ref 19–32)
Calcium: 9.5 mg/dL (ref 8.4–10.5)
Chloride: 99 mEq/L (ref 96–112)
Creat: 0.96 mg/dL (ref 0.50–1.35)
GLUCOSE: 96 mg/dL (ref 70–99)
POTASSIUM: 4 meq/L (ref 3.5–5.3)
Sodium: 137 mEq/L (ref 135–145)
TOTAL PROTEIN: 7.9 g/dL (ref 6.0–8.3)

## 2014-07-29 LAB — PSA: PSA: 5.4 ng/mL — ABNORMAL HIGH (ref <=4.00)

## 2014-07-30 ENCOUNTER — Encounter: Payer: Self-pay | Admitting: Family Medicine

## 2014-07-30 ENCOUNTER — Other Ambulatory Visit: Payer: Self-pay | Admitting: Family Medicine

## 2014-07-30 DIAGNOSIS — R972 Elevated prostate specific antigen [PSA]: Secondary | ICD-10-CM

## 2015-03-01 IMAGING — CR DG CHEST 2V
2 series · 2 of 2 positions shown · non-contrast
Comparison: None.

CLINICAL DATA: Abnormal breath sounds, followup.

EXAM:
CHEST  2 VIEW

[PA]
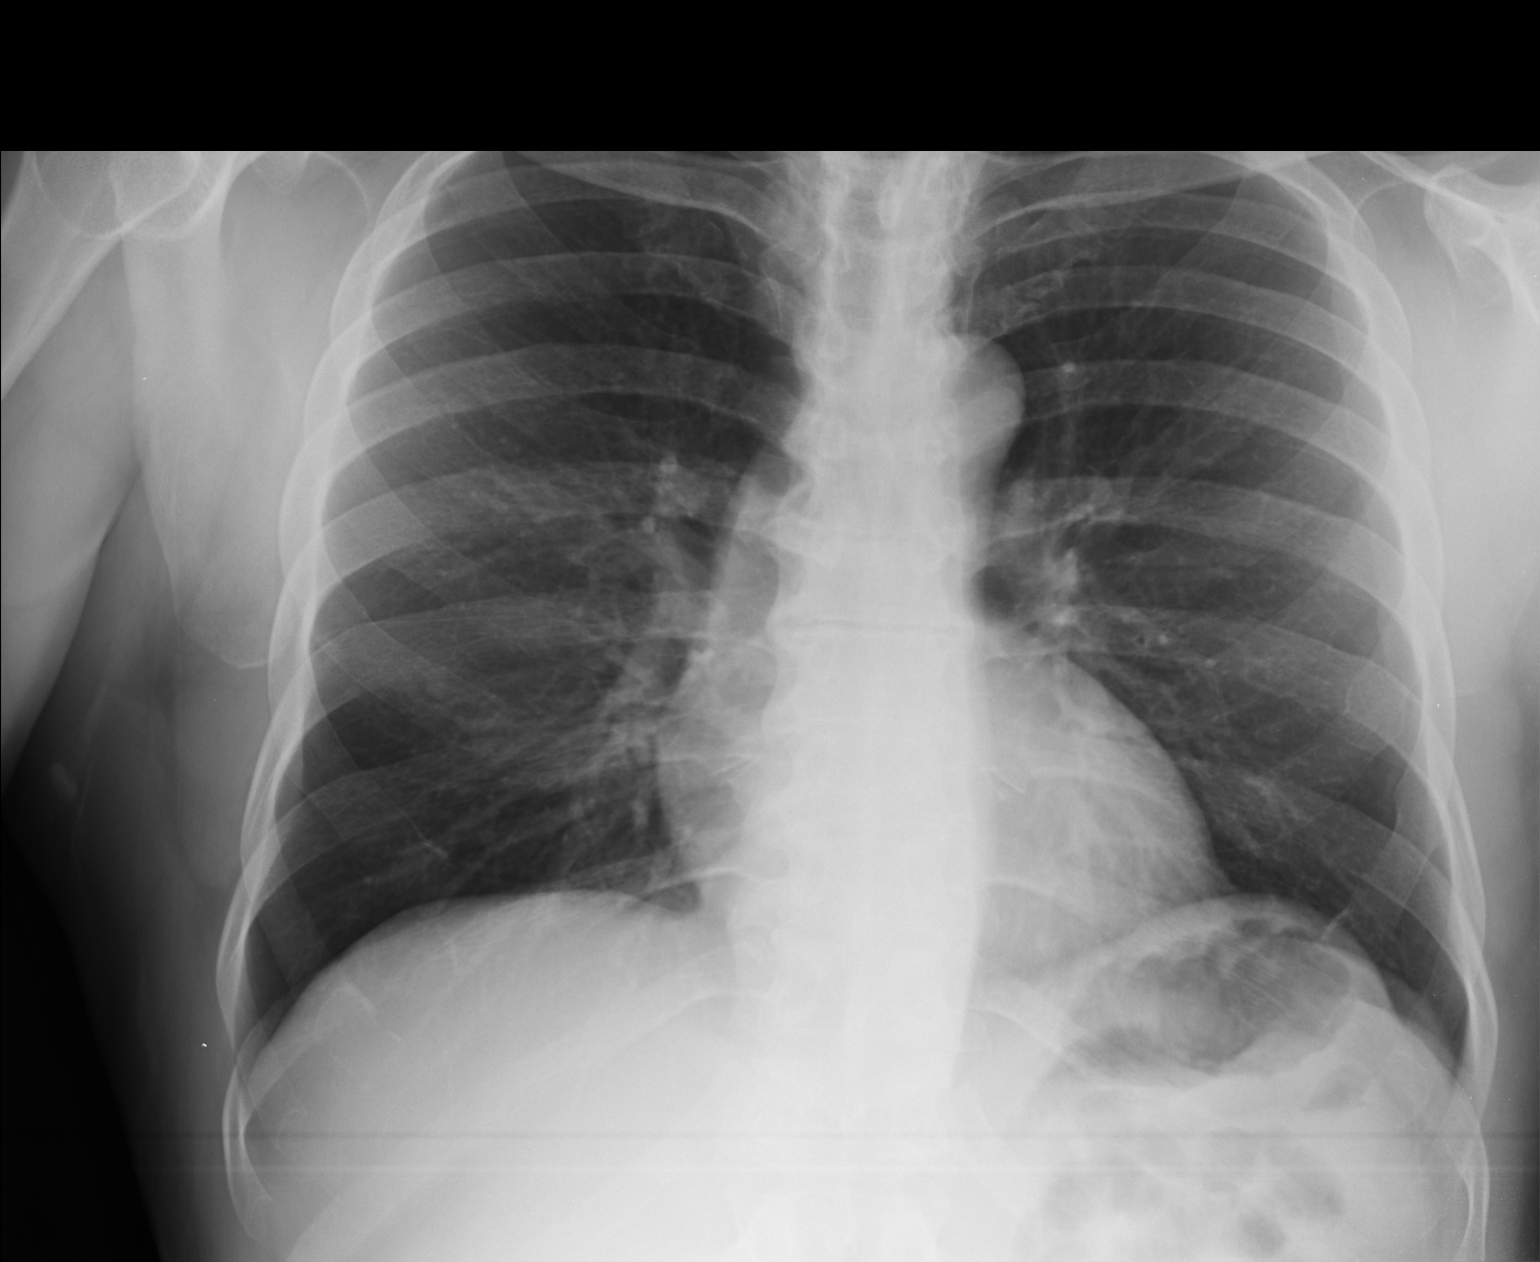

[lateral]
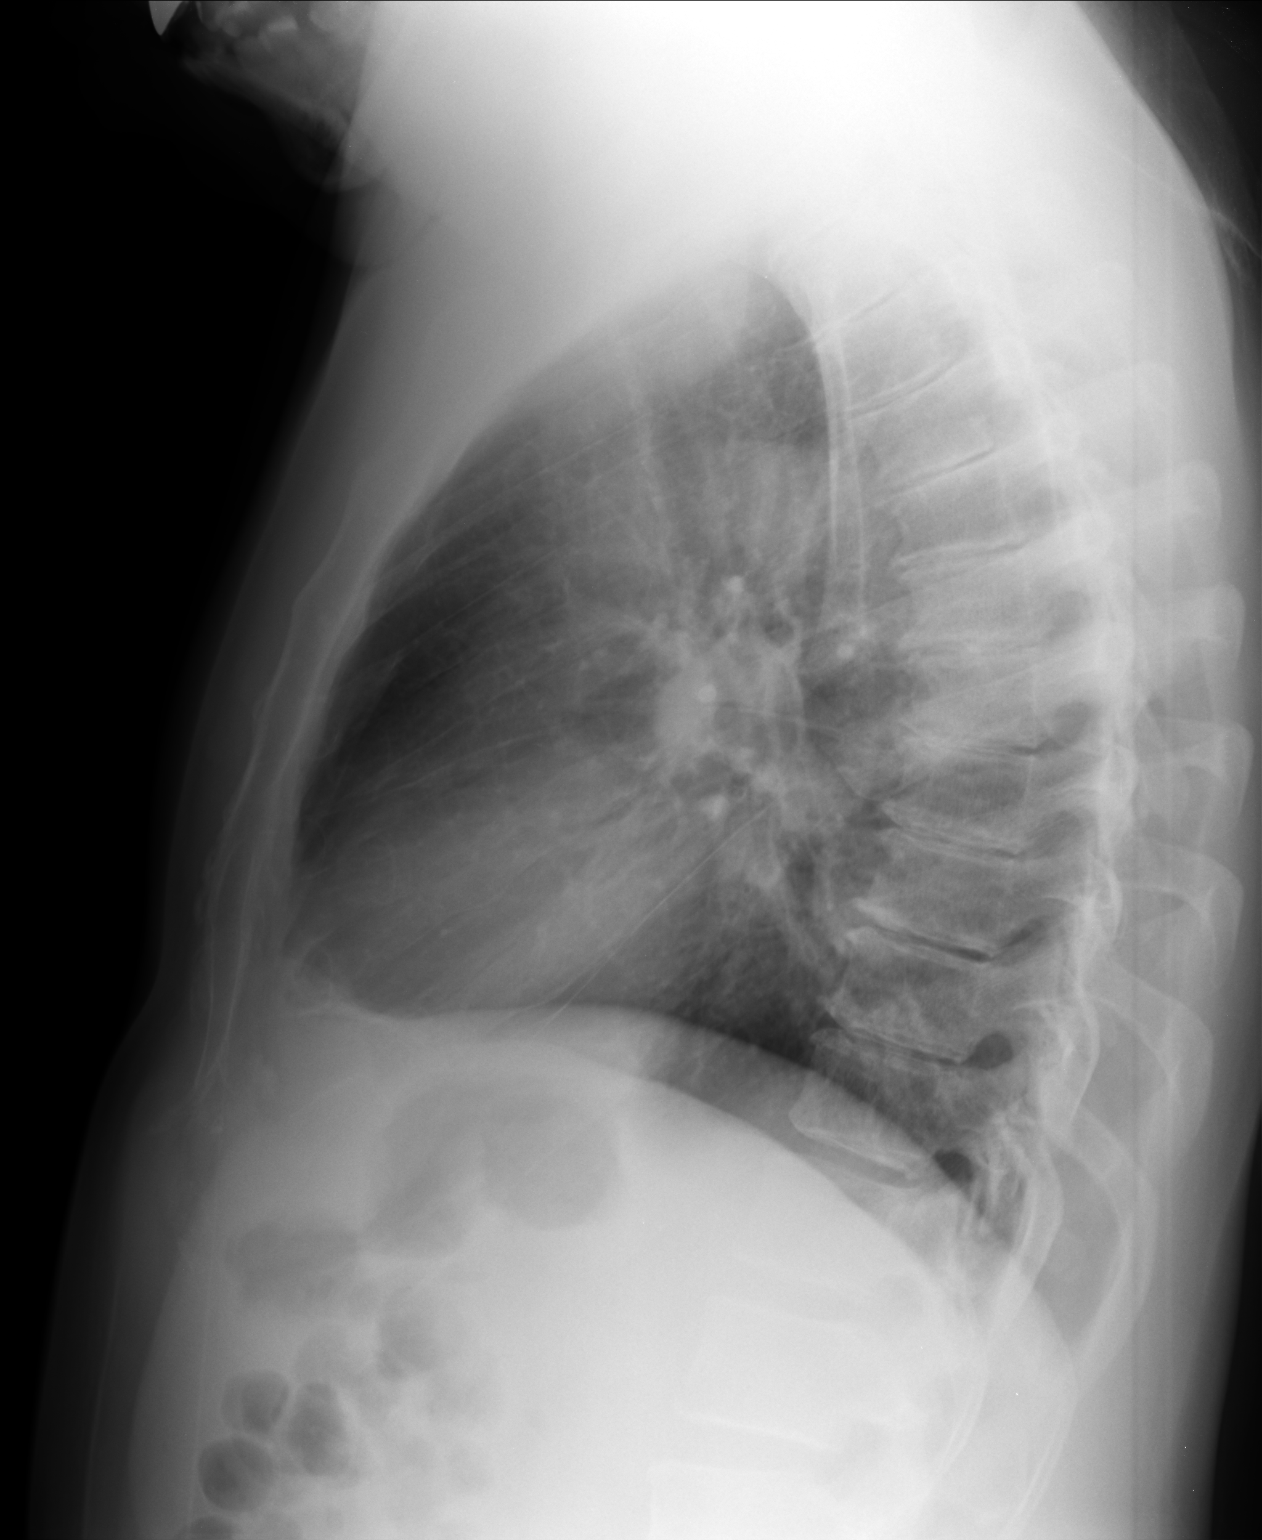

[2 of 2 positions shown; findings below may reference images not displayed]

FINDINGS: The heart size and mediastinal contours are within normal limits.
Both lungs are clear. The visualized skeletal structures are
nonsuspicious, remote left anterior rib fractures. Moderate
degenerative change of the thoracic spine.
IMPRESSION: No active cardiopulmonary disease.

  By: Bobker Paplo

## 2015-07-22 ENCOUNTER — Encounter: Payer: Self-pay | Admitting: Internal Medicine

## 2015-08-25 ENCOUNTER — Encounter: Payer: Self-pay | Admitting: Internal Medicine

## 2015-09-02 ENCOUNTER — Other Ambulatory Visit: Payer: Self-pay | Admitting: Family Medicine

## 2015-10-22 ENCOUNTER — Ambulatory Visit (AMBULATORY_SURGERY_CENTER): Payer: Self-pay | Admitting: *Deleted

## 2015-10-22 ENCOUNTER — Encounter: Payer: Self-pay | Admitting: Internal Medicine

## 2015-10-22 ENCOUNTER — Encounter (INDEPENDENT_AMBULATORY_CARE_PROVIDER_SITE_OTHER): Payer: Self-pay

## 2015-10-22 VITALS — Ht 71.0 in | Wt 207.0 lb

## 2015-10-22 DIAGNOSIS — Z86010 Personal history of colonic polyps: Secondary | ICD-10-CM

## 2015-10-22 NOTE — Progress Notes (Signed)
No egg or soy allergy known to patient  No issues with past sedation with any surgeries  or procedures, no intubation problems  No diet pills per patient No home 02 use per patient  No blood thinners per patient  Pt denies issues with constipation   

## 2015-11-05 ENCOUNTER — Ambulatory Visit (AMBULATORY_SURGERY_CENTER): Payer: 59 | Admitting: Internal Medicine

## 2015-11-05 ENCOUNTER — Encounter: Payer: Self-pay | Admitting: Internal Medicine

## 2015-11-05 VITALS — BP 133/83 | HR 81 | Temp 97.5°F | Resp 13 | Ht 69.5 in | Wt 204.0 lb

## 2015-11-05 DIAGNOSIS — D123 Benign neoplasm of transverse colon: Secondary | ICD-10-CM | POA: Diagnosis not present

## 2015-11-05 DIAGNOSIS — Z86010 Personal history of colonic polyps: Secondary | ICD-10-CM | POA: Diagnosis not present

## 2015-11-05 MED ORDER — SODIUM CHLORIDE 0.9 % IV SOLN: 500.0000 mL | INTRAVENOUS | Status: DC

## 2015-11-05 NOTE — Progress Notes (Signed)
Called to room to assist during endoscopic procedure.  Patient ID and intended procedure confirmed with present staff. Received instructions for my participation in the procedure from the performing physician.  

## 2015-11-05 NOTE — Progress Notes (Signed)
Report to PACU, RN, vss, BBS= Clear.  

## 2015-11-05 NOTE — Op Note (Signed)
[facility] Patient Name: Jeffrey Harrington Procedure Date: 11/05/2015 10:38 AM MRN: [id number] Endoscopist: Gatha Mayer , MD Age: 55 Referring MD:  Date of Birth: 1960/07/27 Gender: Male Account #: [id number] Procedure:                Colonoscopy Indications:              Surveillance: Personal history of adenomatous                            polyps on last colonoscopy 3 years ago Medicines:                Propofol per Anesthesia, Monitored Anesthesia Care Procedure:                Pre-Anesthesia Assessment:                           - Prior to the procedure, a History and Physical                            was performed, and patient medications and                            allergies were reviewed. The patient's tolerance of                            previous anesthesia was also reviewed. The risks                            and benefits of the procedure and the sedation                            options and risks were discussed with the patient.                            All questions were answered, and informed consent                            was obtained. Prior Anticoagulants: The patient has                            taken no previous anticoagulant or antiplatelet                            agents. ASA Grade Assessment: II - A patient with                            mild systemic disease. After reviewing the risks                            and benefits, the patient was deemed in                            satisfactory condition to undergo the procedure.  After obtaining informed consent, the colonoscope                            was passed under direct vision. Throughout the                            procedure, the patient's blood pressure, pulse, and                            oxygen saturations were monitored continuously. The                            Model PCF-H190DL [id number]) scope was introduced   through the anus and advanced to the the cecum,                            identified by appendiceal orifice and ileocecal                            valve. The colonoscopy was performed without                            difficulty. The patient tolerated the procedure                            well. The quality of the bowel preparation was                            good. The bowel preparation used was Miralax. The                            ileocecal valve, appendiceal orifice, and rectum                            were photographed. Scope In: 10:48:04 AM Scope Out: 10:59:25 AM Scope Withdrawal Time: 0 hours 8 minutes 28 seconds  Total Procedure Duration: 0 hours 11 minutes 21 seconds  Findings:                 The perianal and digital rectal examinations were                            normal. Pertinent negatives include normal prostate                            (size, shape, and consistency).                           A 7 mm polyp was found in the transverse colon. The                            polyp was sessile. The polyp was removed with a                            cold snare.  Resection and retrieval were complete.                            Verification of patient identification for the                            specimen was done. Estimated blood loss was minimal.                           The exam was otherwise without abnormality on                            direct and retroflexion views. Complications:            No immediate complications. Estimated Blood Loss:     Estimated blood loss was minimal. Impression:               - One 7 mm polyp in the transverse colon, removed                            with a cold snare. Resected and retrieved.                           - The examination was otherwise normal on direct                            and retroflexion views.                           - Personal history of colonic polyps. Recommendation:           - Patient has a  contact number available for                            emergencies. The signs and symptoms of potential                            delayed complications were discussed with the                            patient. Return to normal activities tomorrow.                            Written discharge instructions were provided to the                            patient.                           - Resume previous diet.                           - Continue present medications.                           - Repeat colonoscopy is recommended for  surveillance. The colonoscopy date will be                            determined after pathology results from today's                            exam become available for review. Gatha Mayer, MD 11/05/2015 11:07:33 AM This report has been signed electronically.

## 2015-11-05 NOTE — Patient Instructions (Addendum)
   One small polyp removed today. I will let you know pathology results and when to have another routine colonoscopy by mail. Looking like 5 years 2022.  I appreciate the opportunity to care for you. Gatha Mayer, MD, FACG  YOU HAD AN ENDOSCOPIC PROCEDURE TODAY AT [facility]:   Refer to the procedure report that was given to you for any specific questions about what was found during the examination.  If the procedure report does not answer your questions, please call your gastroenterologist to clarify.  If you requested that your care partner not be given the details of your procedure findings, then the procedure report has been included in a sealed envelope for you to review at your convenience later.  YOU SHOULD EXPECT: Some feelings of bloating in the abdomen. Passage of more gas than usual.  Walking can help get rid of the air that was put into your GI tract during the procedure and reduce the bloating. If you had a lower endoscopy (such as a colonoscopy or flexible sigmoidoscopy) you may notice spotting of blood in your stool or on the toilet paper. If you underwent a bowel prep for your procedure, you may not have a normal bowel movement for a few days.  Please Note:  You might notice some irritation and congestion in your nose or some drainage.  This is from the oxygen used during your procedure.  There is no need for concern and it should clear up in a day or so.  SYMPTOMS TO REPORT IMMEDIATELY:   Following lower endoscopy (colonoscopy or flexible sigmoidoscopy):  Excessive amounts of blood in the stool  Significant tenderness or worsening of abdominal pains  Swelling of the abdomen that is new, acute  Fever of 100F or higher  For urgent or emergent issues, a gastroenterologist can be reached at any hour by calling [phone].   DIET:  We do recommend a small meal at first, but then you may proceed to your regular diet.  Drink plenty of fluids but you  should avoid alcoholic beverages for 24 hours.  ACTIVITY:  You should plan to take it easy for the rest of today and you should NOT DRIVE or use heavy machinery until tomorrow (because of the sedation medicines used during the test).    FOLLOW UP: Our staff will call the number listed on your records the next business day following your procedure to check on you and address any questions or concerns that you may have regarding the information given to you following your procedure. If we do not reach you, we will leave a message.  However, if you are feeling well and you are not experiencing any problems, there is no need to return our call.  We will assume that you have returned to your regular daily activities without incident.  If any biopsies were taken you will be contacted by phone or by letter within the next 1-3 weeks.  Please call us at [phone] if you have not heard about the biopsies in 3 weeks.    SIGNATURES/CONFIDENTIALITY: You and/or your care partner have signed paperwork which will be entered into your electronic medical record.  These signatures attest to the fact that that the information above on your After Visit Summary has been reviewed and is understood.  Full responsibility of the confidentiality of this discharge information lies with you and/or your care-partner.  Polyps handout provided. Continue present medications.

## 2015-11-06 ENCOUNTER — Telehealth: Payer: Self-pay

## 2015-11-06 NOTE — Telephone Encounter (Signed)
  Follow up Call-  Call back number 11/05/2015  Post procedure Call Back phone  # [phone]  Permission to leave phone message Yes  Some recent data might be hidden    Patient was called for follow up after his procedure on 11/05/2015. No answer at the number given for follow up phone call. A message was left on the answering machine.

## 2015-11-17 ENCOUNTER — Encounter: Payer: Self-pay | Admitting: Internal Medicine

## 2015-11-17 DIAGNOSIS — Z86010 Personal history of colonic polyps: Secondary | ICD-10-CM

## 2015-11-17 NOTE — Progress Notes (Signed)
7 mm adenoma recall 2022

## 2015-12-23 ENCOUNTER — Other Ambulatory Visit: Payer: Self-pay | Admitting: Family Medicine

## 2015-12-23 DIAGNOSIS — I10 Essential (primary) hypertension: Secondary | ICD-10-CM

## 2015-12-25 ENCOUNTER — Other Ambulatory Visit: Payer: Self-pay | Admitting: Physician Assistant

## 2015-12-26 ENCOUNTER — Ambulatory Visit (INDEPENDENT_AMBULATORY_CARE_PROVIDER_SITE_OTHER): Payer: 59 | Admitting: Family Medicine

## 2015-12-26 VITALS — BP 146/84 | HR 85 | Temp 98.6°F | Resp 16 | Ht 69.5 in | Wt 210.6 lb

## 2015-12-26 DIAGNOSIS — Z1329 Encounter for screening for other suspected endocrine disorder: Secondary | ICD-10-CM

## 2015-12-26 DIAGNOSIS — Z87898 Personal history of other specified conditions: Secondary | ICD-10-CM

## 2015-12-26 DIAGNOSIS — I10 Essential (primary) hypertension: Secondary | ICD-10-CM | POA: Diagnosis not present

## 2015-12-26 DIAGNOSIS — Z136 Encounter for screening for cardiovascular disorders: Secondary | ICD-10-CM | POA: Diagnosis not present

## 2015-12-26 LAB — CBC WITH DIFFERENTIAL/PLATELET
BASOS ABS: 58 {cells}/uL (ref 0–200)
Basophils Relative: 1 %
EOS ABS: 116 {cells}/uL (ref 15–500)
EOS PCT: 2 %
HEMATOCRIT: 41.8 % (ref 38.5–50.0)
HEMOGLOBIN: 14.5 g/dL (ref 13.2–17.1)
LYMPHS ABS: 2436 {cells}/uL (ref 850–3900)
Lymphocytes Relative: 42 %
MCH: 27.8 pg (ref 27.0–33.0)
MCHC: 34.7 g/dL (ref 32.0–36.0)
MCV: 80.1 fL (ref 80.0–100.0)
MONO ABS: 638 {cells}/uL (ref 200–950)
MPV: 9.3 fL (ref 7.5–12.5)
Monocytes Relative: 11 %
NEUTROS ABS: 2552 {cells}/uL (ref 1500–7800)
Neutrophils Relative %: 44 %
Platelets: 281 10*3/uL (ref 140–400)
RBC: 5.22 MIL/uL (ref 4.20–5.80)
RDW: 15.4 % — ABNORMAL HIGH (ref 11.0–15.0)
WBC: 5.8 10*3/uL (ref 3.8–10.8)

## 2015-12-26 LAB — LIPID PANEL
CHOL/HDL RATIO: 2.6 ratio (ref <=5.0)
Cholesterol: 196 mg/dL (ref 125–200)
HDL: 76 mg/dL (ref >=40)
LDL CALC: 107 mg/dL (ref <130)
Triglycerides: 65 mg/dL (ref <150)
VLDL: 13 mg/dL (ref <30)

## 2015-12-26 LAB — COMPLETE METABOLIC PANEL WITH GFR
ALBUMIN: 4.7 g/dL (ref 3.6–5.1)
ALT: 38 U/L (ref 9–46)
AST: 48 U/L — ABNORMAL HIGH (ref 10–35)
Alkaline Phosphatase: 49 U/L (ref 40–115)
BILIRUBIN TOTAL: 0.5 mg/dL (ref 0.2–1.2)
BUN: 15 mg/dL (ref 7–25)
CALCIUM: 9.8 mg/dL (ref 8.6–10.3)
CHLORIDE: 100 mmol/L (ref 98–110)
CO2: 28 mmol/L (ref 20–31)
Creat: 0.91 mg/dL (ref 0.70–1.33)
GFR, Est African American: >89 mL/min (ref >=60)
GLUCOSE: 96 mg/dL (ref 65–99)
POTASSIUM: 4 mmol/L (ref 3.5–5.3)
SODIUM: 137 mmol/L (ref 135–146)
TOTAL PROTEIN: 7.8 g/dL (ref 6.1–8.1)

## 2015-12-26 LAB — TSH: TSH: 1.46 m[IU]/L (ref 0.40–4.50)

## 2015-12-26 LAB — PSA: PSA: 4.8 ng/mL — ABNORMAL HIGH (ref <=4.0)

## 2015-12-26 MED ORDER — AMLODIPINE BESYLATE 10 MG PO TABS: ORAL_TABLET | ORAL | 3 refills | Status: DC

## 2015-12-26 MED ORDER — LOSARTAN POTASSIUM-HCTZ 50-12.5 MG PO TABS: 1.0000 | ORAL_TABLET | Freq: Every day | ORAL | 3 refills | Status: DC

## 2015-12-26 NOTE — Progress Notes (Signed)
   Patient ID: Jeffrey Harrington, male    DOB: 1961-02-16, 55 y.o.   MRN: [id number]  PCP: No PCP Per Patient  Chief Complaint  Patient presents with  . Medication Refill    Amlodipine and Losartan     Subjective:   HPI 55 year year old , male, current everyday smoker, presents for medication refill of blood pressure medications. Chronic problems include hypertension and hx of colon adenomas. Last colonoscopy 10/2015.  Hypertension  He reports smoking an average of 1/2 pack /day of cigarettes. He occasional monitors pressures at home with recent readings systolic 123456 and diastolic 99991111. Exercises occasionally by walking. Limits sodium by adhering to no added salt diet. He denies shortness of breath, chest pain, or headache, or lower extremity swelling. Reports diligence in taking medication. Medication ran out several days prior, and he was required to be seen prior to medication being refilled.   Review of Systems HPI  Patient Active Problem List   Diagnosis Date Noted  . HTN (hypertension) 10/02/2013  . Personal history of colonic adenomas 06/07/2012     Prior to Admission medications   Medication Sig Start Date End Date Taking? Authorizing Provider  amLODipine (NORVASC) 10 MG tablet TAKE 1 TABLET(10 MG) BY MOUTH DAILY 09/02/15  Yes Gay Filler Copland, MD  losartan-hydrochlorothiazide (HYZAAR) 50-12.5 MG tablet TAKE 1 TABLET BY MOUTH DAILY 09/02/15  Yes Gay Filler Copland, MD     No Known Allergies     Objective:  Physical Exam  Constitutional: He is oriented to person, place, and time. He appears well-developed and well-nourished.  HENT:  Head: Normocephalic and atraumatic.  Right Ear: External ear normal.  Left Ear: External ear normal.  Nose: Nose normal.  Eyes: Conjunctivae are normal. Pupils are equal, round, and reactive to light.  Neck: Normal range of motion. Neck supple.  Cardiovascular: Normal rate, regular rhythm and normal heart sounds.     Pulmonary/Chest: Effort normal and breath sounds normal.  Musculoskeletal: Normal range of motion.  Neurological: He is alert and oriented to person, place, and time.  Skin: Skin is warm and dry.  Psychiatric: He has a normal mood and affect. His behavior is normal. Judgment and thought content normal.     Vitals:   12/26/15 1625  BP: (!) 146/84  Pulse: 85  Resp: 16  Temp: 98.6 F (37 C)   Assessment & Plan:  1. Essential hypertension, uncontrolled, stable. - CBC with Differential/Platelet - COMPLETE METABOLIC PANEL WITH GFR  Plan: . amLODipine (NORVASC) 10 MG tablet    Sig: TAKE 1 TABLET(10 MG) BY MOUTH DAILY  . losartan-hydrochlorothiazide (HYZAAR) 50-12.5 MG tablet    Sig: Take 1 tablet by mouth daily.    2. History of elevated PSA-Prior PSA levels elevated although measurement in 2016 was 5.40 which was trending downward. Will repeat PSA today. If remains elevated, will place a referral to urology for further evaluation. - PSA  3. Screening for thyroid disorder - TSH  4. Screening for cardiovascular condition* - Lipid panel  Return for hypertension follow-up in 6 months. Continue to monitor blood pressures at home.  Carroll Sage. [name], MSN, FNP-C [facility]

## 2015-12-26 NOTE — Patient Instructions (Addendum)
I will follow-up with you regarding your lab results 7-10 days.  I have refilled your blood pressure medications.  Return for hypertension follow-up in 6 months.  Jeffrey Harrington. [name], MSN, FNP-C [facility]  IF you received an x-ray today, you will receive an invoice from [facility]. Please contact [facility] at [phone] with questions or concerns regarding your invoice.   IF you received labwork today, you will receive an invoice from [employer]. Please contact [facility] at [phone] with questions or concerns regarding your invoice.   Our billing staff will not be able to assist you with questions regarding bills from these companies.  You will be contacted with the lab results as soon as they are available. The fastest way to get your results is to activate your My Chart account. Instructions are located on the last page of this paperwork. If you have not heard from [place] regarding the results in 2 weeks, please contact this office.     Hypertension Hypertension is another name for high blood pressure. High blood pressure forces your heart to work harder to pump blood. A blood pressure reading has two numbers, which includes a higher number over a lower number (example: 110/72). HOME CARE   Have your blood pressure rechecked by your doctor.  Only take medicine as told by your doctor. Follow the directions carefully. The medicine does not work as well if you skip doses. Skipping doses also puts you at risk for problems.  Do not smoke.  Monitor your blood pressure at home as told by your doctor. GET HELP IF:  You think you are having a reaction to the medicine you are taking.  You have repeat headaches or feel dizzy.  You have puffiness (swelling) in your ankles.  You have trouble with your vision. GET HELP RIGHT AWAY IF:   You get a very bad headache and are confused.  You feel  weak, numb, or faint.  You get chest or belly (abdominal) pain.  You throw up (vomit).  You cannot breathe very well. MAKE SURE YOU:   Understand these instructions.  Will watch your condition.  Will get help right away if you are not doing well or get worse.   This information is not intended to replace advice given to you by your health care provider. Make sure you discuss any questions you have with your health care provider.   Document Released: 08/25/2007 Document Revised: 03/13/2013 Document Reviewed: 12/29/2012 Elsevier Interactive Patient Education [employer].

## 2015-12-30 NOTE — Addendum Note (Signed)
Addended by: Scot Jun on: 12/30/2015 05:55 PM   Modules accepted: Orders

## 2015-12-31 NOTE — Progress Notes (Signed)
Message left for patient

## 2016-11-09 DIAGNOSIS — C61 Malignant neoplasm of prostate: Secondary | ICD-10-CM

## 2016-11-09 HISTORY — DX: Malignant neoplasm of prostate: C61

## 2016-12-07 ENCOUNTER — Encounter: Payer: Self-pay | Admitting: Radiation Oncology

## 2016-12-21 ENCOUNTER — Encounter: Payer: Self-pay | Admitting: Radiation Oncology

## 2016-12-21 DIAGNOSIS — C61 Malignant neoplasm of prostate: Secondary | ICD-10-CM | POA: Insufficient documentation

## 2016-12-21 NOTE — Progress Notes (Signed)
GU Location of Tumor / Histology: prostatic adenocarcinoma  If Prostate Cancer, Gleason Score is (4 + 3) and PSA is (6.12) Prostate volume: 42 cc  Quincy Simmonds has had an elevated PSA for some years. Patient returned to Dr. Karsten Ro on 09/14/2016 for evaluation of PSA of 6.12. Patient reports his PSA was elevated four years ago when he evaluated at an urgent care facility for an unrelated issue.   Biopsies of prostate (if applicable) revealed:    Past/Anticipated interventions by urology, if any: biopsy, referral to radiation oncology. Patient reports he is not interested in having a prostatectomy.  Past/Anticipated interventions by medical oncology, if any: no  Weight changes, if any: no  Bowel/Bladder complaints, if any: IPSS 10. Denies dysuria, hematuria, urinary leakage or incontinence.  Nausea/Vomiting, if any: no  Pain issues, if any:  no  SAFETY ISSUES:  Prior radiation? no  Pacemaker/ICD? no  Possible current pregnancy? no  Is the patient on methotrexate? no  Current Complaints / other details:  56 year old male. NKDA. 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  Current everyday smoker. Married with one daughter and one son. BP elevated. Patient reports he has taken both BP medications this morning but, is very anxious.

## 2016-12-22 ENCOUNTER — Telehealth: Payer: Self-pay | Admitting: *Deleted

## 2016-12-22 ENCOUNTER — Encounter: Payer: Self-pay | Admitting: Radiation Oncology

## 2016-12-22 ENCOUNTER — Ambulatory Visit
Admission: RE | Admit: 2016-12-22 | Discharge: 2016-12-22 | Disposition: A | Discharge status: Home / Self Care | Payer: 59 | Source: Ambulatory Visit | Attending: Radiation Oncology | Admitting: Radiation Oncology

## 2016-12-22 DIAGNOSIS — Z510 Encounter for antineoplastic radiation therapy: Secondary | ICD-10-CM | POA: Diagnosis not present

## 2016-12-22 DIAGNOSIS — F17210 Nicotine dependence, cigarettes, uncomplicated: Secondary | ICD-10-CM | POA: Insufficient documentation

## 2016-12-22 DIAGNOSIS — Z79899 Other long term (current) drug therapy: Secondary | ICD-10-CM | POA: Diagnosis not present

## 2016-12-22 DIAGNOSIS — Z9889 Other specified postprocedural states: Secondary | ICD-10-CM | POA: Insufficient documentation

## 2016-12-22 DIAGNOSIS — I10 Essential (primary) hypertension: Secondary | ICD-10-CM | POA: Diagnosis not present

## 2016-12-22 DIAGNOSIS — Z801 Family history of malignant neoplasm of trachea, bronchus and lung: Secondary | ICD-10-CM | POA: Insufficient documentation

## 2016-12-22 DIAGNOSIS — C61 Malignant neoplasm of prostate: Secondary | ICD-10-CM | POA: Insufficient documentation

## 2016-12-22 DIAGNOSIS — Z833 Family history of diabetes mellitus: Secondary | ICD-10-CM | POA: Insufficient documentation

## 2016-12-22 HISTORY — DX: Malignant neoplasm of prostate: C61

## 2016-12-22 NOTE — Progress Notes (Signed)
Radiation Oncology         [phone] ________________________________  Initial outpatient Consultation  Name: Jeffrey Harrington MRN: [id number]  Date: 12/22/2016  DOB: 1960/12/06  DQ:[id number], Carroll Sage, FNP  Kathie Rhodes, MD   REFERRING PHYSICIAN: Kathie Rhodes, MD  DIAGNOSIS: 56 y.o. gentleman with Stage T1c adenocarcinoma of the prostate with Gleason Score of 4+3, and PSA of 6.12.    ICD-10-CM   1. Malignant neoplasm of prostate ([place]) C61     HISTORY OF PRESENT ILLNESS: Jeffrey Harrington is a 56 y.o. male with a diagnosis of prostate cancer. He has a history of elevated PSA since 2015 and was initially referred to Dr. Karsten Ro by his PCP, Molli Barrows, NP for evaluation on 03/09/16 for an elevated PSA of 4.8.  A digital rectal exam was performed at that time which revealed a symmetric prostate gland without nodules or induration.  He elected at that time to proceed with close monitoring with serial PSA and DREs and forego a TRUSPBx.  Serial PSAs remained elevated at 9.27 on 06/15/16, 7.29 at 07/29/16 and 6.12 on 09/10/16.  He followed up with Dr. Karsten Ro on 09/15/16,  digital rectal examination was performed at that time revealing a symmetric prostate gland without nodules or induration and he again elected to continue with close observation.  However, after giving it further thought and consideration, he elected to proceed with TRUSPBx.  The patient proceeded to transrectal ultrasound with 12 biopsies of the prostate on 10/07/16.  The prostate volume measured 41.71 cc.  Out of 12 core biopsies, 7 were positive.  The maximum Gleason score was 4+3, and this was seen in the left base.  There was Gleason 3+3 disease in the left base lateral, left apex, left mid, left mid lateral, right mid lateral and right apex lateral.  CT Pelvis on 11/16/16 showed no definite findings to suggest metastatic disease.   The patient reviewed the biopsy results with his urologist and he has kindly been referred today  for discussion of potential radiation treatment options.    PREVIOUS RADIATION THERAPY: No  PAST MEDICAL HISTORY:  Past Medical History:  Diagnosis Date  . Allergy   . Hypertension   . Personal history of colonic adenomas 06/07/2012   05/2012 - 2 adenomas max 10 mm  . Prostate cancer ([place])   . Syncope 2008      PAST SURGICAL HISTORY: Past Surgical History:  Procedure Laterality Date  . COLONOSCOPY  2014, 2017   polyps  . HEMORRHOID BANDING  2014  . HEMORRHOID SURGERY    . INGUINAL HERNIA REPAIR Right   . PROSTATE BIOPSY      FAMILY HISTORY:  Family History  Problem Relation Age of Onset  . Diabetes Mother   . Lung cancer Father   . Throat cancer Brother        throat/found in lymph nodes  . Colon cancer Neg Hx   . Colon polyps Neg Hx   . Esophageal cancer Neg Hx   . Rectal cancer Neg Hx   . Stomach cancer Neg Hx     SOCIAL HISTORY: He lives and works in [place]. 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 Current everyday smoker. Married with one daughter and one son. Social History   Social History  . Marital status: Married    Spouse name: N/A  . Number of children: 2  . Years of education: N/A   Occupational History  . [employer]  Social History Main Topics  . Smoking status: Current Every Day Smoker    Packs/day: 0.50    Years: 39.00    Types: Cigarettes  . Smokeless tobacco: Never Used  . Alcohol use 1.5 oz/week    3 Standard drinks or equivalent per week     Comment: 2 per day  . Drug use: No  . Sexual activity: Yes   Other Topics Concern  . Not on file   Social History Narrative   Warehouse worker - [employer]   Married, 1 son, 1 daughter    ALLERGIES: Patient has no known allergies.  MEDICATIONS:  Current Outpatient Prescriptions  Medication Sig Dispense Refill  . amLODipine (NORVASC) 10 MG tablet TAKE 1 TABLET(10 MG) BY MOUTH DAILY 90 tablet 3  . losartan-hydrochlorothiazide  (HYZAAR) 50-12.5 MG tablet Take 1 tablet by mouth daily. 90 tablet 3   Current Facility-Administered Medications  Medication Dose Route Frequency Provider Last Rate Last Dose  . 0.9 %  sodium chloride infusion  500 mL Intravenous Continuous Gatha Mayer, MD        REVIEW OF SYSTEMS:  On review of systems, the patient reports that he is doing well overall. He is unaccompanied today. He denies any chest pain, shortness of breath, cough, fevers, chills, night sweats, unintended weight changes. He denies any bowel disturbances, and denies abdominal pain, nausea or vomiting. He denies any new musculoskeletal or joint aches or pains. Patient reports he has taken both BP medications this morning but, is very anxious. His IPSS was 10, indicating moderate urinary symptoms with weak stream and feelings of incomplete emptying. He denies dysuria, hematuria, urinary urgency or incontinence. He describes moderate erectile dysfunction. A complete review of systems is obtained and is otherwise negative.    PHYSICAL EXAM:  Wt Readings from Last 3 Encounters:  12/22/16 202 lb 3.2 oz (91.7 kg)  12/26/15 210 lb 9.6 oz (95.5 kg)  11/05/15 204 lb (92.5 kg)   Temp Readings from Last 3 Encounters:  12/22/16 98 F (36.7 C) (Oral)  12/26/15 98.6 F (37 C) (Oral)  11/05/15 97.5 F (36.4 C)   BP Readings from Last 3 Encounters:  12/22/16 (!) 177/109  12/26/15 (!) 146/84  11/05/15 133/83   Pulse Readings from Last 3 Encounters:  12/22/16 89  12/26/15 85  11/05/15 81   Pain Assessment Pain Score: 0-No pain/10  In general this is a well appearing african [place] male in no acute distress. He is alert and oriented x4 and appropriate throughout the examination. HEENT reveals that the patient is normocephalic, atraumatic. EOMs are intact. PERRLA. Skin is intact without any evidence of gross lesions. Cardiovascular exam reveals a regular rate and rhythm, no clicks rubs or murmurs are auscultated. Chest is  clear to auscultation bilaterally. Lymphatic assessment is performed and does not reveal any adenopathy in the cervical, supraclavicular, axillary, or inguinal chains. Abdomen has active bowel sounds in all quadrants and is intact. The abdomen is soft, non tender, non distended. Lower extremities are negative for pretibial pitting edema, deep calf tenderness, cyanosis or clubbing.   KPS = 100  100 - Normal; no complaints; no evidence of disease. 90   - Able to carry on normal activity; minor signs or symptoms of disease. 80   - Normal activity with effort; some signs or symptoms of disease. 79   - Cares for self; unable to carry on normal activity or to do active work. 60   - Requires occasional assistance, but is  able to care for most of his personal needs. 50   - Requires considerable assistance and frequent medical care. 11   - Disabled; requires special care and assistance. 34   - Severely disabled; hospital admission is indicated although death not imminent. 36   - Very sick; hospital admission necessary; active supportive treatment necessary. 10   - Moribund; fatal processes progressing rapidly. 0     - Dead  Karnofsky DA, Abelmann [place], Craver LS and Burchenal [facility] [phone]) The use of the nitrogen mustards in the palliative treatment of carcinoma: with particular reference to bronchogenic carcinoma Cancer 1 634-56  LABORATORY DATA:  Lab Results  Component Value Date   WBC 5.8 12/26/2015   HGB 14.5 12/26/2015   HCT 41.8 12/26/2015   MCV 80.1 12/26/2015   PLT 281 12/26/2015   Lab Results  Component Value Date   NA 137 12/26/2015   K 4.0 12/26/2015   CL 100 12/26/2015   CO2 28 12/26/2015   Lab Results  Component Value Date   ALT 38 12/26/2015   AST 48 (H) 12/26/2015   ALKPHOS 49 12/26/2015   BILITOT 0.5 12/26/2015     RADIOGRAPHY: No results found.    IMPRESSION/PLAN: 1. 56 y.o. gentleman with Stage T1c adenocarcinoma of the prostate with Gleason Score of 4+3, and PSA of  6.12. Today we reviewed the findings and workup thus far.  We discussed the natural history of prostate cancer.  We reviewed the the implications of T-stage, Gleason's Score, and PSA on decision-making and outcomes in prostate cancer.  We discussed radiation treatment in the management of prostate cancer with regard to the logistics and delivery of external beam radiation treatment as well as the logistics and delivery of prostate brachytherapy.  We discussed and outlined the risks, benefits, short and long-term effects associated with each of these options for radiotherapy and compared and contrasted these with prostatectomy. We also detailed the role of ADT in combination with radiotherapy in the treatment of intermediate risk prostate cancer and outlined the associated side effects that could be expected with this therapy.  While this is not strongly recommended in his case, it is certainly an option.  He would like to avoid ADT unless absolutely necessary.  The patient was encouraged to ask questions that were answered to best of our ability and to his satisfaction.  At the end of our conversation, the patient elects to proceed with prostate IMRT.  We will share our findings with Dr. Karsten Ro and move forward with scheduling placement of three gold fiducial markers into the prostate followed by CT Simulation in preparation to proceed with IMRT in the near future.   We spent 60 minutes face to face with the patient and more than 50% of that time was spent in counseling and/or coordination of care.    Nicholos Johns, PA-C    Tyler Pita, MD  [facility] Direct Dial: [phone]  Fax: [phone] [email or web address]  Skype  [email or web address]   This document serves as a record of services personally performed by Tyler Pita, MD and Freeman Caldron, PA-C. It was created on their behalf by Arlyce Harman, a trained medical scribe. The creation of this record is based on the  scribe's personal observations and the provider's statements to them. This document has been checked and approved by the attending provider.

## 2016-12-22 NOTE — Telephone Encounter (Signed)
CALLED PATIENT TO INFORM OF GOLD SEED PLACEMENT ON 01-25-17- ARRIVAL TIME - 10:45 AM @ DR. OTTELIN'S OFFICE AND SIM APPT. ON 01-27-17 @ 9 AM @ DR. MANNING'S OFFICE, LVM FOR A RETURN CALL

## 2016-12-22 NOTE — Progress Notes (Signed)
See progress note under physician encounter. 

## 2016-12-30 ENCOUNTER — Telehealth: Payer: Self-pay | Admitting: Medical Oncology

## 2016-12-30 NOTE — Telephone Encounter (Signed)
Called to introduce myself as the prostate nurse navigator and my role. I was unable to meet him the day he consulted with Dr. Tammi Klippel. I confirmed with his appointment 01/25/17 at 10:45am for gold markers at [facility] and CT simulation 01/27/17 at 9:00 am with Dr. Tammi Klippel. He questions about the CT simulation and radiation appointments. All questions were answered.  I asked him to call with further questions or concerns. He voiced understanding.

## 2017-01-16 ENCOUNTER — Other Ambulatory Visit: Payer: Self-pay | Admitting: Family Medicine

## 2017-01-22 ENCOUNTER — Encounter: Payer: Self-pay | Admitting: Physician Assistant

## 2017-01-22 ENCOUNTER — Ambulatory Visit (INDEPENDENT_AMBULATORY_CARE_PROVIDER_SITE_OTHER): Payer: 59 | Admitting: Physician Assistant

## 2017-01-22 VITALS — BP 130/80 | HR 83 | Temp 97.8°F | Resp 18 | Ht 69.84 in | Wt 202.6 lb

## 2017-01-22 DIAGNOSIS — I10 Essential (primary) hypertension: Secondary | ICD-10-CM | POA: Diagnosis not present

## 2017-01-22 DIAGNOSIS — Z7689 Persons encountering health services in other specified circumstances: Secondary | ICD-10-CM | POA: Diagnosis not present

## 2017-01-22 DIAGNOSIS — Z720 Tobacco use: Secondary | ICD-10-CM | POA: Diagnosis not present

## 2017-01-22 MED ORDER — LOSARTAN POTASSIUM-HCTZ 50-12.5 MG PO TABS: 1.0000 | ORAL_TABLET | Freq: Every day | ORAL | 1 refills | Status: DC

## 2017-01-22 MED ORDER — BLOOD PRESSURE KIT DEVI: 1.0000 [IU] | Freq: Every day | 0 refills | Status: AC

## 2017-01-22 MED ORDER — AMLODIPINE BESYLATE 10 MG PO TABS: ORAL_TABLET | ORAL | 1 refills | Status: DC

## 2017-01-22 NOTE — Patient Instructions (Addendum)
It was such a pleasure meeting you today.  I have given you medication refills.  In terms of  blood pressure, I would like you to check your blood pressure at least a couple times every week with your new blood pressure cuff outside of the office and document these values. It is best if you check the blood pressure at different times in the day. Your goal is <140/90. If your values are consistently above this goal, please return to office for further evaluation. If you start to have chest pain, blurred vision, shortness of breath, severe headache, lower leg swelling, or nausea/vomiting please seek care immediately here or at the ED. Otherwise, plan to return in about 6 months for complete physical exam and hypertension follow-up.  Bring your blood pressure monitor to the office at that visit so we can compare it to our in-house readings.   In the meantime, I highly recommend you cut down on smoking.  Below this information about smoking cessation and how to take her blood pressure properly.  Thank you for letting me participate in your health and well being. Good luck with your radiation!  Steps to Quit Smoking Smoking tobacco can be bad for your health. It can also affect almost every organ in your body. Smoking puts you and people around you at risk for many serious long-lasting (chronic) diseases. Quitting smoking is hard, but it is one of the best things that you can do for your health. It is never too late to quit. What are the benefits of quitting smoking? When you quit smoking, you lower your risk for getting serious diseases and conditions. They can include:  Lung cancer or lung disease.  Heart disease.  Stroke.  Heart attack.  Not being able to have children (infertility).  Weak bones (osteoporosis) and broken bones (fractures).  If you have coughing, wheezing, and shortness of breath, those symptoms may get better when you quit. You may also get sick less often. If you are  pregnant, quitting smoking can help to lower your chances of having a baby of low birth weight. What can I do to help me quit smoking? Talk with your doctor about what can help you quit smoking. Some things you can do (strategies) include:  Quitting smoking totally, instead of slowly cutting back how much you smoke over a period of time.  Going to in-person counseling. You are more likely to quit if you go to many counseling sessions.  Using resources and support systems, such as: ? Database administrator with a Social worker. ? Phone quitlines. ? Careers information officer. ? Support groups or group counseling. ? Text messaging programs. ? Mobile phone apps or applications.  Taking medicines. Some of these medicines may have nicotine in them. If you are pregnant or breastfeeding, do not take any medicines to quit smoking unless your doctor says it is okay. Talk with your doctor about counseling or other things that can help you.  Talk with your doctor about using more than one strategy at the same time, such as taking medicines while you are also going to in-person counseling. This can help make quitting easier. What things can I do to make it easier to quit? Quitting smoking might feel very hard at first, but there is a lot that you can do to make it easier. Take these steps:  Talk to your family and friends. Ask them to support and encourage you.  Call phone quitlines, reach out to support groups, or work with  a Social worker.  Ask people who smoke to not smoke around you.  Avoid places that make you want (trigger) to smoke, such as: ? Bars. ? Parties. ? Smoke-break areas at work.  Spend time with people who do not smoke.  Lower the stress in your life. Stress can make you want to smoke. Try these things to help your stress: ? Getting regular exercise. ? Deep-breathing exercises. ? Yoga. ? Meditating. ? Doing a body scan. To do this, close your eyes, focus on one area of your body at a time  from head to toe, and notice which parts of your body are tense. Try to relax the muscles in those areas.  Download or buy apps on your mobile phone or tablet that can help you stick to your quit plan. There are many free apps, such as QuitGuide from the [employer] Office manager for Disease Control and Prevention). You can find more support from smokefree.gov and other websites.  This information is not intended to replace advice given to you by your health care provider. Make sure you discuss any questions you have with your health care provider. Document Released: 01/02/2009 Document Revised: 11/04/2015 Document Reviewed: 07/23/2014 Elsevier Interactive Patient Education  2018 [employer].    How to Take Your Blood Pressure You can take your blood pressure at home with a machine. You may need to check your blood pressure at home:  To check if you have high blood pressure (hypertension).  To check your blood pressure over time.  To make sure your blood pressure medicine is working.  Supplies needed: You will need a blood pressure machine, or monitor. You can buy one at a drugstore or online. When choosing one:  Choose one with an arm cuff.  Choose one that wraps around your upper arm. Only one finger should fit between your arm and the cuff.  Do not choose one that measures your blood pressure from your wrist or finger.  Your doctor can suggest a monitor. How to prepare Avoid these things for 30 minutes before checking your blood pressure:  Drinking caffeine.  Drinking alcohol.  Eating.  Smoking.  Exercising.  Five minutes before checking your blood pressure:  Pee.  Sit in a dining chair. Avoid sitting in a soft couch or armchair.  Be quiet. Do not talk.  How to take your blood pressure Follow the instructions that came with your machine. If you have a digital blood pressure monitor, these may be the instructions: 1. Sit up straight. 2. Place your feet on the floor. Do not  cross your ankles or legs. 3. Rest your left arm at the level of your heart. You may rest it on a table, desk, or chair. 4. Pull up your shirt sleeve. 5. Wrap the blood pressure cuff around the upper part of your left arm. The cuff should be 1 inch (2.5 cm) above your elbow. It is best to wrap the cuff around bare skin. 6. Fit the cuff snugly around your arm. You should be able to place only one finger between the cuff and your arm. 7. Put the cord inside the groove of your elbow. 8. Press the power button. 9. Sit quietly while the cuff fills with air and loses air. 10. Write down the numbers on the screen. 11. Wait 2-3 minutes and then repeat steps 1-10.  What do the numbers mean? Two numbers make up your blood pressure. The first number is called systolic pressure. The second is called diastolic pressure.  An example of a blood pressure reading is "120 over 80" (or 120/80). If you are an adult and do not have a medical condition, use this guide to find out if your blood pressure is normal: Normal  First number: below 120.  Second number: below 80. Elevated  First number: 120-129.  Second number: below 80. Hypertension stage 1  First number: 130-139.  Second number: 80-89. Hypertension stage 2  First number: 140 or above.  Second number: 43 or above. Your blood pressure is above normal even if only the top or bottom number is above normal. Follow these instructions at home:  Check your blood pressure as often as your doctor tells you to.  Take your monitor to your next doctor's appointment. Your doctor will: ? Make sure you are using it correctly. ? Make sure it is working right.  Make sure you understand what your blood pressure numbers should be.  Tell your doctor if your medicines are causing side effects. Contact a doctor if:  Your blood pressure keeps being high. Get help right away if:  Your first blood pressure number is higher than 180.  Your second blood  pressure number is higher than 120. This information is not intended to replace advice given to you by your health care provider. Make sure you discuss any questions you have with your health care provider. Document Released: 02/19/2008 Document Revised: 02/04/2016 Document Reviewed: 08/15/2015 Elsevier Interactive Patient Education  2018 [place] Eating Plan DASH stands for "Dietary Approaches to Stop Hypertension." The DASH eating plan is a healthy eating plan that has been shown to reduce high blood pressure (hypertension). It may also reduce your risk for type 2 diabetes, heart disease, and stroke. The DASH eating plan may also help with weight loss. What are tips for following this plan? General guidelines  Avoid eating more than 2,300 mg (milligrams) of salt (sodium) a day. If you have hypertension, you may need to reduce your sodium intake to 1,500 mg a day.  Limit alcohol intake to no more than 1 drink a day for nonpregnant women and 2 drinks a day for men. One drink equals 12 oz of beer, 5 oz of wine, or 1 oz of hard liquor.  Work with your health care provider to maintain a healthy body weight or to lose weight. Ask what an ideal weight is for you.  Get at least 30 minutes of exercise that causes your heart to beat faster (aerobic exercise) most days of the week. Activities may include walking, swimming, or biking.  Work with your health care provider or diet and nutrition specialist (dietitian) to adjust your eating plan to your individual calorie needs. Reading food labels  Check food labels for the amount of sodium per serving. Choose foods with less than 5 percent of the Daily Value of sodium. Generally, foods with less than 300 mg of sodium per serving fit into this eating plan.  To find whole grains, look for the word "whole" as the first word in the ingredient list. Shopping  Buy products labeled as "low-sodium" or "no salt added."  Buy fresh foods. Avoid  canned foods and premade or frozen meals. Cooking  Avoid adding salt when cooking. Use salt-free seasonings or herbs instead of table salt or sea salt. Check with your health care provider or pharmacist before using salt substitutes.  Do not fry foods. Cook foods using healthy methods such as baking, boiling, grilling, and broiling instead.  Lacinda Axon  with heart-healthy oils, such as olive, canola, soybean, or sunflower oil. Meal planning   Eat a balanced diet that includes: ? 5 or more servings of fruits and vegetables each day. At each meal, try to fill half of your plate with fruits and vegetables. ? Up to 6-8 servings of whole grains each day. ? Less than 6 oz of lean meat, poultry, or fish each day. A 3-oz serving of meat is about the same size as a deck of cards. One egg equals 1 oz. ? 2 servings of low-fat dairy each day. ? A serving of nuts, seeds, or beans 5 times each week. ? Heart-healthy fats. Healthy fats called Omega-3 fatty acids are found in foods such as flaxseeds and coldwater fish, like sardines, salmon, and mackerel.  Limit how much you eat of the following: ? Canned or prepackaged foods. ? Food that is high in trans fat, such as fried foods. ? Food that is high in saturated fat, such as fatty meat. ? Sweets, desserts, sugary drinks, and other foods with added sugar. ? Full-fat dairy products.  Do not salt foods before eating.  Try to eat at least 2 vegetarian meals each week.  Eat more home-cooked food and less restaurant, buffet, and fast food.  When eating at a restaurant, ask that your food be prepared with less salt or no salt, if possible. What foods are recommended? The items listed may not be a complete list. Talk with your dietitian about what dietary choices are best for you. Grains Whole-grain or whole-wheat bread. Whole-grain or whole-wheat pasta. Brown rice. Modena Morrow. Bulgur. Whole-grain and low-sodium cereals. Pita bread. Low-fat, low-sodium  crackers. Whole-wheat flour tortillas. Vegetables Fresh or frozen vegetables (raw, steamed, roasted, or grilled). Low-sodium or reduced-sodium tomato and vegetable juice. Low-sodium or reduced-sodium tomato sauce and tomato paste. Low-sodium or reduced-sodium canned vegetables. Fruits All fresh, dried, or frozen fruit. Canned fruit in natural juice (without added sugar). Meat and other protein foods Skinless chicken or [place]. Ground chicken or [place]. Pork with fat trimmed off. Fish and seafood. Egg whites. Dried beans, peas, or lentils. Unsalted nuts, nut butters, and seeds. Unsalted canned beans. Lean cuts of beef with fat trimmed off. Low-sodium, lean deli meat. Dairy Low-fat (1%) or fat-free (skim) milk. Fat-free, low-fat, or reduced-fat cheeses. Nonfat, low-sodium ricotta or cottage cheese. Low-fat or nonfat yogurt. Low-fat, low-sodium cheese. Fats and oils Soft margarine without trans fats. Vegetable oil. Low-fat, reduced-fat, or light mayonnaise and salad dressings (reduced-sodium). Canola, safflower, olive, soybean, and sunflower oils. Avocado. Seasoning and other foods Herbs. Spices. Seasoning mixes without salt. Unsalted popcorn and pretzels. Fat-free sweets. What foods are not recommended? The items listed may not be a complete list. Talk with your dietitian about what dietary choices are best for you. Grains Baked goods made with fat, such as croissants, muffins, or some breads. Dry pasta or rice meal packs. Vegetables Creamed or fried vegetables. Vegetables in a cheese sauce. Regular canned vegetables (not low-sodium or reduced-sodium). Regular canned tomato sauce and paste (not low-sodium or reduced-sodium). Regular tomato and vegetable juice (not low-sodium or reduced-sodium). Angie Fava. Olives. Fruits Canned fruit in a light or heavy syrup. Fried fruit. Fruit in cream or butter sauce. Meat and other protein foods Fatty cuts of meat. Ribs. Fried meat. Berniece Salines. Sausage. Bologna and  other processed lunch meats. Salami. Fatback. Hotdogs. Bratwurst. Salted nuts and seeds. Canned beans with added salt. Canned or smoked fish. Whole eggs or egg yolks. Chicken or [place] with skin. Dairy Whole  or 2% milk, cream, and half-and-half. Whole or full-fat cream cheese. Whole-fat or sweetened yogurt. Full-fat cheese. Nondairy creamers. Whipped toppings. Processed cheese and cheese spreads. Fats and oils Butter. Stick margarine. Lard. Shortening. Ghee. Bacon fat. Tropical oils, such as coconut, palm kernel, or palm oil. Seasoning and other foods Salted popcorn and pretzels. Onion salt, garlic salt, seasoned salt, table salt, and sea salt. Worcestershire sauce. Tartar sauce. Barbecue sauce. Teriyaki sauce. Soy sauce, including reduced-sodium. Steak sauce. Canned and packaged gravies. Fish sauce. Oyster sauce. Cocktail sauce. Horseradish that you find on the shelf. Ketchup. Mustard. Meat flavorings and tenderizers. Bouillon cubes. Hot sauce and Tabasco sauce. Premade or packaged marinades. Premade or packaged taco seasonings. Relishes. Regular salad dressings. Where to find more information:  National Heart, Lung, and [place]: https://wilson-eaton.com/  American Heart Association: www.heart.org Summary  The DASH eating plan is a healthy eating plan that has been shown to reduce high blood pressure (hypertension). It may also reduce your risk for type 2 diabetes, heart disease, and stroke.  With the DASH eating plan, you should limit salt (sodium) intake to 2,300 mg a day. If you have hypertension, you may need to reduce your sodium intake to 1,500 mg a day.  When on the DASH eating plan, aim to eat more fresh fruits and vegetables, whole grains, lean proteins, low-fat dairy, and heart-healthy fats.  Work with your health care provider or diet and nutrition specialist (dietitian) to adjust your eating plan to your individual calorie needs. This information is not intended to replace advice  given to you by your health care provider. Make sure you discuss any questions you have with your health care provider. Document Released: 02/25/2011 Document Revised: 03/01/2016 Document Reviewed: 03/01/2016 Elsevier Interactive Patient Education  2017 [employer].   IF you received an x-ray today, you will receive an invoice from [facility]. Please contact [facility] at [phone] with questions or concerns regarding your invoice.   IF you received labwork today, you will receive an invoice from [place]. Please contact [facility] at [phone] with questions or concerns regarding your invoice.   Our billing staff will not be able to assist you with questions regarding bills from these companies.  You will be contacted with the lab results as soon as they are available. The fastest way to get your results is to activate your My Chart account. Instructions are located on the last page of this paperwork. If you have not heard from [place] regarding the results in 2 weeks, please contact this office.

## 2017-01-22 NOTE — Progress Notes (Signed)
VONG GARRINGER  MRN: [id number] DOB: 1960/10/06  Subjective:  Jeffrey Harrington is a 56 y.o. male seen in office today for a chief complaint to establish care and medication refill.  He is fasting today.  He is to be followed by NP Harris; however, she no longer works here.  Medical conditions:  1) HTN: Has had a diagnosis for years.Currently managed with amlodipine 10 mg and losartan/HCTZ 50/12.5 mg daily. Patient is checking blood pressure at home, range is [id number] systolic.  His blood pressure cuff is about 57 years old.  Reports no symptoms. Denies lightheadedness, dizziness, chronic headache, double vision, chest pain, shortness of breath, heart racing, palpitations, nausea, vomiting, abdominal pain, hematuria, lower leg swelling.  He smokes about half a pack a day.  He drinks about 2 alcoholic beverages per day.  Diet consists of pop out for breakfast, salad and sandwich for lunch, and meat and vegetables for dinner.  He drinks mostly water,excess sodas and sweet teas.  He does not perform structured exercise but notes his job avoids is very strenuous and requires him to lift heavy weights every day.  Denies any other aggravating or relieving factors, no other questions or concerns.  2) Stage T1c adenocarcinoma of the prostate: Dx 11/09/2016. Followed by Dr. Tammi Klippel, oncology and Dr. Karsten Ro, urology. He has follow up with Dr. Tammi Klippel 01/25/17 to start prostate IMRT.  He is very optimistic about this treatment.  Review of Systems  Per HPI  Patient Active Problem List   Diagnosis Date Noted  . Malignant neoplasm of prostate ([place]) 12/21/2016  . HTN (hypertension) 10/02/2013  . Personal history of colonic adenomas 06/07/2012    No current outpatient prescriptions on file prior to visit.   Current Facility-Administered Medications on File Prior to Visit  Medication Dose Route Frequency Provider Last Rate Last Dose  . 0.9 %  sodium chloride infusion  500 mL Intravenous Continuous Gatha Mayer, MD        No Known Allergies   Objective:  BP 130/80 (BP Location: Left Arm, Patient Position: Sitting, Cuff Size: Large)   Pulse 83   Temp 97.8 F (36.6 C) (Oral)   Resp 18   Ht 5' 9.84" (1.774 m)   Wt 202 lb 9.6 oz (91.9 kg)   SpO2 98%   BMI 29.20 kg/m   Physical Exam  Constitutional: He is oriented to person, place, and time and well-developed, well-nourished, and in no distress.  HENT:  Head: Normocephalic and atraumatic.  Mouth/Throat: Uvula is midline and oropharynx is clear and moist.    Eyes: Conjunctivae are normal.  Neck: Normal range of motion.  Cardiovascular: Normal rate, regular rhythm, normal heart sounds and normal pulses.   Pulmonary/Chest: Effort normal and breath sounds normal. He has no wheezes. He has no rhonchi. He has no rales.  Musculoskeletal:       Right lower leg: He exhibits no edema.       Left lower leg: He exhibits no edema.  Neurological: He is alert and oriented to person, place, and time. Gait normal.  Skin: Skin is warm and dry.  Psychiatric: Affect normal.  Vitals reviewed.   Assessment and Plan :  1. Essential hypertension Well controlled in office.  Labs pending.  I have given patient a prescription for new blood pressure monitor to use at home to ensure that his home readings are accurate.  Encouraged him to check a few times a week and document these values.  His goal  is < 140/90.  If the values are consistently above this goal, please return office for further evaluation. Otherwise, plan to return in 6 months for complete physical exam and hypertension follow-up.  Encouraged pt to bring his blood pressure monitor to this visit so we can compare it to our office readings.  Given strict ED precautions. - CMP14+EGFR - CBC with Differential/Platelet - Blood Pressure Monitoring (BLOOD PRESSURE KIT) DEVI; 1 Units by Does not apply route daily.  Dispense: 1 Device; Refill: 0 - losartan-hydrochlorothiazide (HYZAAR) 50-12.5 MG tablet;  Take 1 tablet by mouth daily.  Dispense: 90 tablet; Refill: 1 - amLODipine (NORVASC) 10 MG tablet; TAKE 1 TABLET(10 MG) BY MOUTH DAILY  Dispense: 90 tablet; Refill: 1 - Lipid panel  2. Tobacco use  Strongly encouraged patient to quit smoking.  Smoking cessation materials given to patient.   3. Encounter to establish care I have agreed to take over patient's care.  Tenna Delaine PA-C  [facility] 01/22/2017 1:26 PM

## 2017-01-23 LAB — CBC WITH DIFFERENTIAL/PLATELET
BASOS ABS: 0.1 10*3/uL (ref 0.0–0.2)
BASOS: 1 %
EOS (ABSOLUTE): 0.1 10*3/uL (ref 0.0–0.4)
Eos: 2 %
Hematocrit: 41.6 % (ref 37.5–51.0)
Hemoglobin: 14.3 g/dL (ref 13.0–17.7)
IMMATURE GRANULOCYTES: 0 %
Immature Grans (Abs): 0 10*3/uL (ref 0.0–0.1)
LYMPHS: 55 %
Lymphocytes Absolute: 2.4 10*3/uL (ref 0.7–3.1)
MCH: 27.9 pg (ref 26.6–33.0)
MCHC: 34.4 g/dL (ref 31.5–35.7)
MCV: 81 fL (ref 79–97)
MONOS ABS: 0.4 10*3/uL (ref 0.1–0.9)
Monocytes: 10 %
NEUTROS ABS: 1.4 10*3/uL (ref 1.4–7.0)
NEUTROS PCT: 32 %
PLATELETS: 310 10*3/uL (ref 150–379)
RBC: 5.12 x10E6/uL (ref 4.14–5.80)
RDW: 16 % — AB (ref 12.3–15.4)
WBC: 4.3 10*3/uL (ref 3.4–10.8)

## 2017-01-23 LAB — CMP14+EGFR
A/G RATIO: 1.7 (ref 1.2–2.2)
ALT: 31 IU/L (ref 0–44)
AST: 46 IU/L — AB (ref 0–40)
Albumin: 5 g/dL (ref 3.5–5.5)
Alkaline Phosphatase: 60 IU/L (ref 39–117)
BILIRUBIN TOTAL: 0.3 mg/dL (ref 0.0–1.2)
BUN/Creatinine Ratio: 11 (ref 9–20)
BUN: 9 mg/dL (ref 6–24)
CALCIUM: 9.4 mg/dL (ref 8.7–10.2)
CHLORIDE: 99 mmol/L (ref 96–106)
CO2: 24 mmol/L (ref 20–29)
Creatinine, Ser: 0.85 mg/dL (ref 0.76–1.27)
GFR calc Af Amer: 112 mL/min/{1.73_m2} (ref >59)
GFR, EST NON AFRICAN AMERICAN: 97 mL/min/{1.73_m2} (ref >59)
GLOBULIN, TOTAL: 3 g/dL (ref 1.5–4.5)
Glucose: 88 mg/dL (ref 65–99)
POTASSIUM: 4.2 mmol/L (ref 3.5–5.2)
SODIUM: 139 mmol/L (ref 134–144)
Total Protein: 8 g/dL (ref 6.0–8.5)

## 2017-01-23 LAB — LIPID PANEL
CHOLESTEROL TOTAL: 203 mg/dL — AB (ref 100–199)
Chol/HDL Ratio: 2.8 ratio (ref 0.0–5.0)
HDL: 72 mg/dL (ref >39)
LDL Calculated: 107 mg/dL — ABNORMAL HIGH (ref 0–99)
Triglycerides: 120 mg/dL (ref 0–149)
VLDL Cholesterol Cal: 24 mg/dL (ref 5–40)

## 2017-01-25 ENCOUNTER — Other Ambulatory Visit: Payer: Self-pay | Admitting: Physician Assistant

## 2017-01-25 MED ORDER — ATORVASTATIN CALCIUM 20 MG PO TABS: 20.0000 mg | ORAL_TABLET | Freq: Every day | ORAL | 3 refills | Status: DC

## 2017-01-25 NOTE — Progress Notes (Signed)
Meds ordered this encounter  Medications  . atorvastatin (LIPITOR) 20 MG tablet    Sig: Take 1 tablet (20 mg total) daily by mouth.    Dispense:  90 tablet    Refill:  3    Order Specific Question:   Supervising Provider    Answer:   Wardell Honour [[id number]]

## 2017-01-27 ENCOUNTER — Ambulatory Visit
Admission: RE | Admit: 2017-01-27 | Discharge: 2017-01-27 | Disposition: A | Discharge status: Home / Self Care | Payer: 59 | Source: Ambulatory Visit | Attending: Radiation Oncology | Admitting: Radiation Oncology

## 2017-01-27 DIAGNOSIS — Z510 Encounter for antineoplastic radiation therapy: Secondary | ICD-10-CM | POA: Diagnosis not present

## 2017-01-27 DIAGNOSIS — C61 Malignant neoplasm of prostate: Secondary | ICD-10-CM

## 2017-01-27 NOTE — Progress Notes (Signed)
  Radiation Oncology         [phone] ________________________________  Name: Jeffrey Harrington MRN: [id number]  Date: 01/27/2017  DOB: 09-01-1960  SIMULATION AND TREATMENT PLANNING NOTE    ICD-10-CM   1. Malignant neoplasm of prostate ([place]) C61     DIAGNOSIS: 56 y.o. gentleman with Stage T1c adenocarcinoma of the prostate with Gleason Score of 4+3, and PSA of 6.12.  NARRATIVE:  The patient was brought to the [facility].  Identity was confirmed.  All relevant records and images related to the planned course of therapy were reviewed.  The patient freely provided informed written consent to proceed with treatment after reviewing the details related to the planned course of therapy. The consent form was witnessed and verified by the simulation staff.  Then, the patient was set-up in a stable reproducible supine position for radiation therapy.  A vacuum lock pillow device was custom fabricated to position his legs in a reproducible immobilized position.  Then, I performed a urethrogram under sterile conditions to identify the prostatic apex.  CT images were obtained.  Surface markings were placed.  The CT images were loaded into the planning software.  Then the prostate target and avoidance structures including the rectum, bladder, bowel and hips were contoured.  Treatment planning then occurred.  The radiation prescription was entered and confirmed.  A total of one complex treatment devices was fabricated. I have requested : Intensity Modulated Radiotherapy (IMRT) is medically necessary for this case for the following reason:  Rectal sparing.Marland Kitchen  PLAN:  The patient will receive 78 Gy in 40 fractions.  ________________________________  Sheral Apley Tammi Klippel, M.D.  This document serves as a record of services personally performed by Tyler Pita, MD. It was created on his behalf by Valeta Harms, a trained medical scribe. The creation of this record is based on the scribe's personal  observations and the provider's statements to them. This document has been checked and approved by the attending provider.

## 2017-02-02 DIAGNOSIS — Z510 Encounter for antineoplastic radiation therapy: Secondary | ICD-10-CM | POA: Diagnosis not present

## 2017-02-07 ENCOUNTER — Ambulatory Visit
Admission: RE | Admit: 2017-02-07 | Discharge: 2017-02-07 | Disposition: A | Discharge status: Home / Self Care | Payer: 59 | Source: Ambulatory Visit | Attending: Radiation Oncology | Admitting: Radiation Oncology

## 2017-02-07 DIAGNOSIS — Z510 Encounter for antineoplastic radiation therapy: Secondary | ICD-10-CM | POA: Diagnosis not present

## 2017-02-08 ENCOUNTER — Ambulatory Visit
Admission: RE | Admit: 2017-02-08 | Discharge: 2017-02-08 | Disposition: A | Discharge status: Home / Self Care | Payer: 59 | Source: Ambulatory Visit | Attending: Radiation Oncology | Admitting: Radiation Oncology

## 2017-02-08 DIAGNOSIS — Z510 Encounter for antineoplastic radiation therapy: Secondary | ICD-10-CM | POA: Diagnosis not present

## 2017-02-09 ENCOUNTER — Ambulatory Visit
Admission: RE | Admit: 2017-02-09 | Discharge: 2017-02-09 | Disposition: A | Discharge status: Home / Self Care | Payer: 59 | Source: Ambulatory Visit | Attending: Radiation Oncology | Admitting: Radiation Oncology

## 2017-02-09 DIAGNOSIS — Z510 Encounter for antineoplastic radiation therapy: Secondary | ICD-10-CM | POA: Diagnosis not present

## 2017-02-14 ENCOUNTER — Ambulatory Visit
Admission: RE | Admit: 2017-02-14 | Discharge: 2017-02-14 | Disposition: A | Discharge status: Home / Self Care | Payer: 59 | Source: Ambulatory Visit | Attending: Radiation Oncology | Admitting: Radiation Oncology

## 2017-02-14 DIAGNOSIS — Z510 Encounter for antineoplastic radiation therapy: Secondary | ICD-10-CM | POA: Diagnosis not present

## 2017-02-15 ENCOUNTER — Ambulatory Visit
Admission: RE | Admit: 2017-02-15 | Discharge: 2017-02-15 | Disposition: A | Discharge status: Home / Self Care | Payer: 59 | Source: Ambulatory Visit | Attending: Radiation Oncology | Admitting: Radiation Oncology

## 2017-02-15 DIAGNOSIS — Z510 Encounter for antineoplastic radiation therapy: Secondary | ICD-10-CM | POA: Diagnosis not present

## 2017-02-16 ENCOUNTER — Ambulatory Visit
Admission: RE | Admit: 2017-02-16 | Discharge: 2017-02-16 | Disposition: A | Discharge status: Home / Self Care | Payer: 59 | Source: Ambulatory Visit | Attending: Radiation Oncology | Admitting: Radiation Oncology

## 2017-02-16 DIAGNOSIS — Z510 Encounter for antineoplastic radiation therapy: Secondary | ICD-10-CM | POA: Diagnosis not present

## 2017-02-17 ENCOUNTER — Ambulatory Visit
Admission: RE | Admit: 2017-02-17 | Discharge: 2017-02-17 | Disposition: A | Discharge status: Home / Self Care | Payer: 59 | Source: Ambulatory Visit | Attending: Radiation Oncology | Admitting: Radiation Oncology

## 2017-02-17 DIAGNOSIS — Z510 Encounter for antineoplastic radiation therapy: Secondary | ICD-10-CM | POA: Diagnosis not present

## 2017-02-18 ENCOUNTER — Ambulatory Visit
Admission: RE | Admit: 2017-02-18 | Discharge: 2017-02-18 | Disposition: A | Discharge status: Home / Self Care | Payer: 59 | Source: Ambulatory Visit | Attending: Radiation Oncology | Admitting: Radiation Oncology

## 2017-02-18 DIAGNOSIS — Z510 Encounter for antineoplastic radiation therapy: Secondary | ICD-10-CM | POA: Diagnosis not present

## 2017-02-21 ENCOUNTER — Ambulatory Visit
Admission: RE | Admit: 2017-02-21 | Discharge: 2017-02-21 | Disposition: A | Discharge status: Home / Self Care | Payer: 59 | Source: Ambulatory Visit | Attending: Radiation Oncology | Admitting: Radiation Oncology

## 2017-02-21 DIAGNOSIS — Z510 Encounter for antineoplastic radiation therapy: Secondary | ICD-10-CM | POA: Diagnosis not present

## 2017-02-22 ENCOUNTER — Ambulatory Visit
Admission: RE | Admit: 2017-02-22 | Discharge: 2017-02-22 | Disposition: A | Discharge status: Home / Self Care | Payer: 59 | Source: Ambulatory Visit | Attending: Radiation Oncology | Admitting: Radiation Oncology

## 2017-02-22 DIAGNOSIS — Z510 Encounter for antineoplastic radiation therapy: Secondary | ICD-10-CM | POA: Diagnosis not present

## 2017-02-23 ENCOUNTER — Ambulatory Visit
Admission: RE | Admit: 2017-02-23 | Discharge: 2017-02-23 | Disposition: A | Discharge status: Home / Self Care | Payer: 59 | Source: Ambulatory Visit | Attending: Radiation Oncology | Admitting: Radiation Oncology

## 2017-02-23 DIAGNOSIS — Z510 Encounter for antineoplastic radiation therapy: Secondary | ICD-10-CM | POA: Diagnosis not present

## 2017-02-24 ENCOUNTER — Ambulatory Visit
Admission: RE | Admit: 2017-02-24 | Discharge: 2017-02-24 | Disposition: A | Discharge status: Home / Self Care | Payer: 59 | Source: Ambulatory Visit | Attending: Radiation Oncology | Admitting: Radiation Oncology

## 2017-02-24 DIAGNOSIS — Z510 Encounter for antineoplastic radiation therapy: Secondary | ICD-10-CM | POA: Diagnosis not present

## 2017-02-25 ENCOUNTER — Ambulatory Visit
Admission: RE | Admit: 2017-02-25 | Discharge: 2017-02-25 | Disposition: A | Discharge status: Home / Self Care | Payer: 59 | Source: Ambulatory Visit | Attending: Radiation Oncology | Admitting: Radiation Oncology

## 2017-02-25 DIAGNOSIS — Z510 Encounter for antineoplastic radiation therapy: Secondary | ICD-10-CM | POA: Diagnosis not present

## 2017-02-28 ENCOUNTER — Ambulatory Visit: Payer: 59

## 2017-03-01 ENCOUNTER — Ambulatory Visit
Admission: RE | Admit: 2017-03-01 | Discharge: 2017-03-01 | Disposition: A | Discharge status: Home / Self Care | Payer: 59 | Source: Ambulatory Visit | Attending: Radiation Oncology | Admitting: Radiation Oncology

## 2017-03-01 DIAGNOSIS — Z510 Encounter for antineoplastic radiation therapy: Secondary | ICD-10-CM | POA: Diagnosis not present

## 2017-03-02 ENCOUNTER — Ambulatory Visit
Admission: RE | Admit: 2017-03-02 | Discharge: 2017-03-02 | Disposition: A | Discharge status: Home / Self Care | Payer: 59 | Source: Ambulatory Visit | Attending: Radiation Oncology | Admitting: Radiation Oncology

## 2017-03-02 DIAGNOSIS — Z510 Encounter for antineoplastic radiation therapy: Secondary | ICD-10-CM | POA: Diagnosis not present

## 2017-03-03 ENCOUNTER — Ambulatory Visit
Admission: RE | Admit: 2017-03-03 | Discharge: 2017-03-03 | Disposition: A | Discharge status: Home / Self Care | Payer: 59 | Source: Ambulatory Visit | Attending: Radiation Oncology | Admitting: Radiation Oncology

## 2017-03-03 DIAGNOSIS — Z510 Encounter for antineoplastic radiation therapy: Secondary | ICD-10-CM | POA: Diagnosis not present

## 2017-03-04 ENCOUNTER — Ambulatory Visit
Admission: RE | Admit: 2017-03-04 | Discharge: 2017-03-04 | Disposition: A | Discharge status: Home / Self Care | Payer: 59 | Source: Ambulatory Visit | Attending: Radiation Oncology | Admitting: Radiation Oncology

## 2017-03-04 DIAGNOSIS — Z510 Encounter for antineoplastic radiation therapy: Secondary | ICD-10-CM | POA: Diagnosis not present

## 2017-03-05 ENCOUNTER — Ambulatory Visit
Admission: RE | Admit: 2017-03-05 | Discharge: 2017-03-05 | Disposition: A | Discharge status: Home / Self Care | Payer: 59 | Source: Ambulatory Visit | Attending: Radiation Oncology | Admitting: Radiation Oncology

## 2017-03-05 DIAGNOSIS — Z510 Encounter for antineoplastic radiation therapy: Secondary | ICD-10-CM | POA: Diagnosis not present

## 2017-03-07 ENCOUNTER — Ambulatory Visit
Admission: RE | Admit: 2017-03-07 | Discharge: 2017-03-07 | Disposition: A | Discharge status: Home / Self Care | Payer: 59 | Source: Ambulatory Visit | Attending: Radiation Oncology | Admitting: Radiation Oncology

## 2017-03-07 DIAGNOSIS — Z510 Encounter for antineoplastic radiation therapy: Secondary | ICD-10-CM | POA: Diagnosis not present

## 2017-03-08 ENCOUNTER — Ambulatory Visit
Admission: RE | Admit: 2017-03-08 | Discharge: 2017-03-08 | Disposition: A | Discharge status: Home / Self Care | Payer: 59 | Source: Ambulatory Visit | Attending: Radiation Oncology | Admitting: Radiation Oncology

## 2017-03-08 DIAGNOSIS — Z510 Encounter for antineoplastic radiation therapy: Secondary | ICD-10-CM | POA: Diagnosis not present

## 2017-03-09 ENCOUNTER — Ambulatory Visit
Admission: RE | Admit: 2017-03-09 | Discharge: 2017-03-09 | Disposition: A | Discharge status: Home / Self Care | Payer: 59 | Source: Ambulatory Visit | Attending: Radiation Oncology | Admitting: Radiation Oncology

## 2017-03-09 DIAGNOSIS — Z510 Encounter for antineoplastic radiation therapy: Secondary | ICD-10-CM | POA: Diagnosis not present

## 2017-03-10 ENCOUNTER — Ambulatory Visit
Admission: RE | Admit: 2017-03-10 | Discharge: 2017-03-10 | Disposition: A | Discharge status: Home / Self Care | Payer: 59 | Source: Ambulatory Visit | Attending: Radiation Oncology | Admitting: Radiation Oncology

## 2017-03-10 DIAGNOSIS — Z510 Encounter for antineoplastic radiation therapy: Secondary | ICD-10-CM | POA: Diagnosis not present

## 2017-03-11 ENCOUNTER — Ambulatory Visit
Admission: RE | Admit: 2017-03-11 | Discharge: 2017-03-11 | Disposition: A | Discharge status: Home / Self Care | Payer: 59 | Source: Ambulatory Visit | Attending: Radiation Oncology | Admitting: Radiation Oncology

## 2017-03-11 DIAGNOSIS — Z510 Encounter for antineoplastic radiation therapy: Secondary | ICD-10-CM | POA: Diagnosis not present

## 2017-03-12 ENCOUNTER — Ambulatory Visit: Payer: 59

## 2017-03-14 ENCOUNTER — Ambulatory Visit
Admission: RE | Admit: 2017-03-14 | Discharge: 2017-03-14 | Disposition: A | Discharge status: Home / Self Care | Payer: 59 | Source: Ambulatory Visit | Attending: Radiation Oncology | Admitting: Radiation Oncology

## 2017-03-14 DIAGNOSIS — Z510 Encounter for antineoplastic radiation therapy: Secondary | ICD-10-CM | POA: Diagnosis not present

## 2017-03-16 ENCOUNTER — Ambulatory Visit
Admission: RE | Admit: 2017-03-16 | Discharge: 2017-03-16 | Disposition: A | Discharge status: Home / Self Care | Payer: 59 | Source: Ambulatory Visit | Attending: Radiation Oncology | Admitting: Radiation Oncology

## 2017-03-16 DIAGNOSIS — Z510 Encounter for antineoplastic radiation therapy: Secondary | ICD-10-CM | POA: Diagnosis not present

## 2017-03-17 ENCOUNTER — Ambulatory Visit
Admission: RE | Admit: 2017-03-17 | Discharge: 2017-03-17 | Disposition: A | Discharge status: Home / Self Care | Payer: 59 | Source: Ambulatory Visit | Attending: Radiation Oncology | Admitting: Radiation Oncology

## 2017-03-17 DIAGNOSIS — Z510 Encounter for antineoplastic radiation therapy: Secondary | ICD-10-CM | POA: Diagnosis not present

## 2017-03-18 ENCOUNTER — Ambulatory Visit
Admission: RE | Admit: 2017-03-18 | Discharge: 2017-03-18 | Disposition: A | Discharge status: Home / Self Care | Payer: 59 | Source: Ambulatory Visit | Attending: Radiation Oncology | Admitting: Radiation Oncology

## 2017-03-18 DIAGNOSIS — Z510 Encounter for antineoplastic radiation therapy: Secondary | ICD-10-CM | POA: Diagnosis not present

## 2017-03-21 ENCOUNTER — Ambulatory Visit
Admission: RE | Admit: 2017-03-21 | Discharge: 2017-03-21 | Disposition: A | Discharge status: Home / Self Care | Payer: 59 | Source: Ambulatory Visit | Attending: Radiation Oncology | Admitting: Radiation Oncology

## 2017-03-21 DIAGNOSIS — Z510 Encounter for antineoplastic radiation therapy: Secondary | ICD-10-CM | POA: Diagnosis not present

## 2017-03-23 ENCOUNTER — Ambulatory Visit
Admission: RE | Admit: 2017-03-23 | Discharge: 2017-03-23 | Disposition: A | Discharge status: Home / Self Care | Payer: 59 | Source: Ambulatory Visit | Attending: Radiation Oncology | Admitting: Radiation Oncology

## 2017-03-23 DIAGNOSIS — C61 Malignant neoplasm of prostate: Secondary | ICD-10-CM | POA: Insufficient documentation

## 2017-03-23 DIAGNOSIS — R5383 Other fatigue: Secondary | ICD-10-CM | POA: Insufficient documentation

## 2017-03-23 DIAGNOSIS — Z510 Encounter for antineoplastic radiation therapy: Secondary | ICD-10-CM | POA: Insufficient documentation

## 2017-03-24 ENCOUNTER — Ambulatory Visit
Admission: RE | Admit: 2017-03-24 | Discharge: 2017-03-24 | Disposition: A | Discharge status: Home / Self Care | Payer: 59 | Source: Ambulatory Visit | Attending: Radiation Oncology | Admitting: Radiation Oncology

## 2017-03-24 DIAGNOSIS — Z510 Encounter for antineoplastic radiation therapy: Secondary | ICD-10-CM | POA: Diagnosis not present

## 2017-03-25 ENCOUNTER — Ambulatory Visit
Admission: RE | Admit: 2017-03-25 | Discharge: 2017-03-25 | Disposition: A | Discharge status: Home / Self Care | Payer: 59 | Source: Ambulatory Visit | Attending: Radiation Oncology | Admitting: Radiation Oncology

## 2017-03-25 DIAGNOSIS — Z510 Encounter for antineoplastic radiation therapy: Secondary | ICD-10-CM | POA: Diagnosis not present

## 2017-03-28 ENCOUNTER — Ambulatory Visit
Admission: RE | Admit: 2017-03-28 | Discharge: 2017-03-28 | Disposition: A | Discharge status: Home / Self Care | Payer: 59 | Source: Ambulatory Visit | Attending: Radiation Oncology | Admitting: Radiation Oncology

## 2017-03-28 DIAGNOSIS — Z510 Encounter for antineoplastic radiation therapy: Secondary | ICD-10-CM | POA: Diagnosis not present

## 2017-03-29 ENCOUNTER — Ambulatory Visit
Admission: RE | Admit: 2017-03-29 | Discharge: 2017-03-29 | Disposition: A | Discharge status: Home / Self Care | Payer: 59 | Source: Ambulatory Visit | Attending: Radiation Oncology | Admitting: Radiation Oncology

## 2017-03-29 DIAGNOSIS — Z510 Encounter for antineoplastic radiation therapy: Secondary | ICD-10-CM | POA: Diagnosis not present

## 2017-03-30 ENCOUNTER — Ambulatory Visit
Admission: RE | Admit: 2017-03-30 | Discharge: 2017-03-30 | Disposition: A | Discharge status: Home / Self Care | Payer: 59 | Source: Ambulatory Visit | Attending: Radiation Oncology | Admitting: Radiation Oncology

## 2017-03-30 DIAGNOSIS — Z510 Encounter for antineoplastic radiation therapy: Secondary | ICD-10-CM | POA: Diagnosis not present

## 2017-03-31 ENCOUNTER — Ambulatory Visit
Admission: RE | Admit: 2017-03-31 | Discharge: 2017-03-31 | Disposition: A | Discharge status: Home / Self Care | Payer: 59 | Source: Ambulatory Visit | Attending: Radiation Oncology | Admitting: Radiation Oncology

## 2017-03-31 DIAGNOSIS — Z510 Encounter for antineoplastic radiation therapy: Secondary | ICD-10-CM | POA: Diagnosis not present

## 2017-04-01 ENCOUNTER — Ambulatory Visit
Admission: RE | Admit: 2017-04-01 | Discharge: 2017-04-01 | Disposition: A | Discharge status: Home / Self Care | Payer: 59 | Source: Ambulatory Visit | Attending: Radiation Oncology | Admitting: Radiation Oncology

## 2017-04-01 DIAGNOSIS — Z510 Encounter for antineoplastic radiation therapy: Secondary | ICD-10-CM | POA: Diagnosis not present

## 2017-04-04 ENCOUNTER — Ambulatory Visit
Admission: RE | Admit: 2017-04-04 | Discharge: 2017-04-04 | Disposition: A | Discharge status: Home / Self Care | Payer: 59 | Source: Ambulatory Visit | Attending: Radiation Oncology | Admitting: Radiation Oncology

## 2017-04-04 DIAGNOSIS — Z510 Encounter for antineoplastic radiation therapy: Secondary | ICD-10-CM | POA: Diagnosis not present

## 2017-04-05 ENCOUNTER — Ambulatory Visit
Admission: RE | Admit: 2017-04-05 | Discharge: 2017-04-05 | Disposition: A | Discharge status: Home / Self Care | Payer: 59 | Source: Ambulatory Visit | Attending: Radiation Oncology | Admitting: Radiation Oncology

## 2017-04-05 DIAGNOSIS — Z510 Encounter for antineoplastic radiation therapy: Secondary | ICD-10-CM | POA: Diagnosis not present

## 2017-04-06 ENCOUNTER — Ambulatory Visit
Admission: RE | Admit: 2017-04-06 | Discharge: 2017-04-06 | Disposition: A | Discharge status: Home / Self Care | Payer: 59 | Source: Ambulatory Visit | Attending: Radiation Oncology | Admitting: Radiation Oncology

## 2017-04-06 DIAGNOSIS — Z510 Encounter for antineoplastic radiation therapy: Secondary | ICD-10-CM | POA: Diagnosis not present

## 2017-04-07 ENCOUNTER — Ambulatory Visit: Payer: 59

## 2017-04-07 ENCOUNTER — Encounter: Payer: Self-pay | Admitting: Radiation Oncology

## 2017-04-07 ENCOUNTER — Ambulatory Visit
Admission: RE | Admit: 2017-04-07 | Discharge: 2017-04-07 | Disposition: A | Discharge status: Home / Self Care | Payer: 59 | Source: Ambulatory Visit | Attending: Radiation Oncology | Admitting: Radiation Oncology

## 2017-04-07 DIAGNOSIS — Z510 Encounter for antineoplastic radiation therapy: Secondary | ICD-10-CM | POA: Diagnosis not present

## 2017-04-08 ENCOUNTER — Ambulatory Visit: Payer: 59

## 2017-04-08 NOTE — Progress Notes (Signed)
  Radiation Oncology         [phone] ________________________________  Name: Jeffrey Harrington MRN: [id number]  Date: 04/07/2017  DOB: [birth year]  End of Treatment Note  Diagnosis:   57 y.o. gentleman with Stage T1c adenocarcinoma of the prostate with Gleason Score of 4+3, and PSA of 6.12.  Indication for treatment:  Curative, Definitive Radiotherapy       Radiation treatment dates:   02/07/2017 to 04/07/2017  Site/dose:   The prostate was treated to 78 Gy in 40 fractions of 1.95 Gy  Beams/energy:   The patient was treated with IMRT using volumetric arc therapy delivering 6 MV X-rays to clockwise and counterclockwise circumferential arcs with a 90 degree collimator offset to avoid dose scalloping.  Image guidance was performed with daily cone beam CT prior to each fraction to align to gold markers in the prostate and assure proper bladder and rectal fill volumes.  Immobilization was achieved with BodyFix custom mold.  Narrative: The patient tolerated radiation treatment relatively well.  He experienced some minor urinary irritation and modest fatigue. He denied dysuria, hematuria, or issues with his bowels. He reported nocturia x 3-4, moderate stream, and some urgency.   Plan: The patient has completed radiation treatment. He will return to radiation oncology clinic for routine followup in one month. I advised him to call or return sooner if he has any questions or concerns related to his recovery or treatment. ________________________________  Sheral Apley. Tammi Klippel, M.D.  This document serves as a record of services personally performed by Tyler Pita, MD. It was created on his behalf by Arlyce Harman, a trained medical scribe. The creation of this record is based on the scribe's personal observations and the provider's statements to them. This document has been checked and approved by the attending provider.

## 2017-05-10 NOTE — Progress Notes (Signed)
Radiation Oncology         [phone] ________________________________  Name: Jeffrey Harrington MRN: [id number]  Date: 05/11/2017  DOB: 10/13/1960  Post Treatment Note  CC: Brantley Persons, MD  Diagnosis:   57 y.o.gentleman with Stage T1c adenocarcinoma of the prostate with Gleason Score of 4+3, and PSA of 6.12.  Interval Since Last Radiation:  4 weeks  02/07/2017 to 04/07/2017:   The prostate was treated to 78 Gy in 40 fractions of 1.95 Gy  Narrative:  The patient returns today for routine follow-up.  He tolerated radiation treatment relatively well.  He experienced some minor urinary irritation and modest fatigue. He denied dysuria, hematuria, or issues with his bowels. He reported nocturia x 3-4, moderate stream, and some urgency.                          On review of systems, the patient states that he is doing very well overall.  His urinary symptoms are gradually improving but he continues with frequency, urgency, weak stream and occasional feelings of incomplete emptying.  He reports nocturia 2-3 times per night which is normal for him.  His current IPSS score is 20 indicating severe urinary symptoms.  He denies dysuria, gross hematuria, fever, chills or night sweats.  He denies abdominal pain, nausea, vomiting or diarrhea.  He reports a healthy appetite and is maintaining his weight.  He is noticing a gradual return of his energy level.  Overall, he is pleased with his progress to date.  ALLERGIES:  has No Known Allergies.  Meds: Current Outpatient Medications  Medication Sig Dispense Refill  . amLODipine (NORVASC) 10 MG tablet TAKE 1 TABLET(10 MG) BY MOUTH DAILY 90 tablet 1  . atorvastatin (LIPITOR) 20 MG tablet Take 1 tablet (20 mg total) daily by mouth. 90 tablet 3  . Blood Pressure Monitoring (BLOOD PRESSURE KIT) DEVI 1 Units by Does not apply route daily. 1 Device 0  . losartan-hydrochlorothiazide (HYZAAR) 50-12.5 MG tablet Take 1 tablet by mouth  daily. 90 tablet 1   Current Facility-Administered Medications  Medication Dose Route Frequency Provider Last Rate Last Dose  . 0.9 %  sodium chloride infusion  500 mL Intravenous Continuous Gatha Mayer, MD        Physical Findings:  height is 5' 9.84" (1.774 m) and weight is 207 lb 12.8 oz (94.3 kg). His oral temperature is 97.7 F (36.5 C). His blood pressure is 172/92 (abnormal) and his pulse is 84. His respiration is 20 and oxygen saturation is 99%.  Pain Assessment Pain Score: 0-No pain/10 In general this is a well appearing African-American male in no acute distress.  He's alert and oriented x4 and appropriate throughout the examination. Cardiopulmonary assessment is negative for acute distress and he exhibits normal effort.   Lab Findings: Lab Results  Component Value Date   WBC 4.3 01/22/2017   HGB 14.3 01/22/2017   HCT 41.6 01/22/2017   MCV 81 01/22/2017   PLT 310 01/22/2017     Radiographic Findings: No results found.  Impression/Plan: 1. 57 y.o.gentleman with Stage T1c adenocarcinoma of the prostate with Gleason Score of 4+3, and PSA of 6.12.   He will continue to follow up with urology for ongoing PSA determinations and has an appointment scheduled with Dr. Karsten Ro  in April 2019. He understands what to expect with regards to PSA monitoring going forward. I will look forward to following his response  to treatment via correspondence with urology, and would be happy to continue to participate in his care if clinically indicated. I talked to the patient about what to expect in the future, including his risk for erectile dysfunction and rectal bleeding. I encouraged him to call or return to the office if he has any questions regarding his previous radiation or possible radiation side effects. He was comfortable with this plan and will follow up as needed.    Nicholos Johns, PA-C

## 2017-05-11 ENCOUNTER — Ambulatory Visit
Admission: RE | Admit: 2017-05-11 | Discharge: 2017-05-11 | Disposition: A | Discharge status: Home / Self Care | Payer: 59 | Source: Ambulatory Visit | Attending: Radiation Oncology | Admitting: Radiation Oncology

## 2017-05-11 ENCOUNTER — Encounter: Payer: Self-pay | Admitting: Urology

## 2017-05-11 VITALS — BP 172/92 | HR 84 | Temp 97.7°F | Resp 20 | Ht 69.84 in | Wt 207.8 lb

## 2017-05-11 DIAGNOSIS — R351 Nocturia: Secondary | ICD-10-CM | POA: Diagnosis not present

## 2017-05-11 DIAGNOSIS — Z923 Personal history of irradiation: Secondary | ICD-10-CM | POA: Diagnosis not present

## 2017-05-11 DIAGNOSIS — Z79899 Other long term (current) drug therapy: Secondary | ICD-10-CM | POA: Insufficient documentation

## 2017-05-11 DIAGNOSIS — C61 Malignant neoplasm of prostate: Secondary | ICD-10-CM

## 2017-07-19 ENCOUNTER — Other Ambulatory Visit: Payer: Self-pay | Admitting: Physician Assistant

## 2017-07-19 DIAGNOSIS — I10 Essential (primary) hypertension: Secondary | ICD-10-CM

## 2017-08-16 ENCOUNTER — Other Ambulatory Visit: Payer: Self-pay | Admitting: Physician Assistant

## 2017-08-16 DIAGNOSIS — I10 Essential (primary) hypertension: Secondary | ICD-10-CM

## 2017-09-05 ENCOUNTER — Other Ambulatory Visit: Payer: Self-pay | Admitting: Physician Assistant

## 2017-09-05 DIAGNOSIS — I10 Essential (primary) hypertension: Secondary | ICD-10-CM

## 2017-09-07 ENCOUNTER — Telehealth: Payer: Self-pay | Admitting: Physician Assistant

## 2017-09-07 NOTE — Telephone Encounter (Signed)
Copied from [place] [phone]. Topic: Quick Communication - Rx Refill/Question >> Sep 07, 2017 11:20 AM Selinda Flavin B, NT wrote: Medication: losartan-hydrochlorothiazide (HYZAAR) 50-12.5 MG tablet amLODipine (NORVASC) 10 MG tablet   Has the patient contacted their pharmacy? Yes.   (Agent: If no, request that the patient contact the pharmacy for the refill.) (Agent: If yes, when and what did the pharmacy advise?)  Preferred Pharmacy (with phone number or street name): [id number] - [place], [address] [place]  Agent: Please be advised that RX refills may take up to 3 business days. We ask that you follow-up with your pharmacy.    Patient has been scheduled for a medication refill appointment for 09/12/17. Would like to know if enough medication could be sent to the pharmacy to last until his appointment.

## 2017-09-08 ENCOUNTER — Other Ambulatory Visit: Payer: Self-pay

## 2017-09-08 DIAGNOSIS — I10 Essential (primary) hypertension: Secondary | ICD-10-CM

## 2017-09-08 MED ORDER — AMLODIPINE BESYLATE 10 MG PO TABS: ORAL_TABLET | ORAL | 0 refills | Status: DC

## 2017-09-08 MED ORDER — LOSARTAN POTASSIUM-HCTZ 50-12.5 MG PO TABS: 1.0000 | ORAL_TABLET | Freq: Every day | ORAL | 0 refills | Status: DC

## 2017-09-12 ENCOUNTER — Ambulatory Visit: Payer: 59 | Admitting: Physician Assistant

## 2017-09-13 ENCOUNTER — Encounter: Payer: Self-pay | Admitting: Physician Assistant

## 2017-09-13 ENCOUNTER — Ambulatory Visit: Payer: 59 | Admitting: Physician Assistant

## 2017-09-13 ENCOUNTER — Other Ambulatory Visit: Payer: Self-pay

## 2017-09-13 VITALS — BP 160/80 | HR 93 | Temp 99.0°F | Resp 16 | Ht 70.47 in | Wt 207.0 lb

## 2017-09-13 DIAGNOSIS — Z9189 Other specified personal risk factors, not elsewhere classified: Secondary | ICD-10-CM | POA: Diagnosis not present

## 2017-09-13 DIAGNOSIS — I10 Essential (primary) hypertension: Secondary | ICD-10-CM

## 2017-09-13 DIAGNOSIS — Z720 Tobacco use: Secondary | ICD-10-CM

## 2017-09-13 MED ORDER — LOSARTAN POTASSIUM-HCTZ 100-12.5 MG PO TABS: 1.0000 | ORAL_TABLET | Freq: Every day | ORAL | 1 refills | Status: DC

## 2017-09-13 MED ORDER — ATORVASTATIN CALCIUM 20 MG PO TABS: 20.0000 mg | ORAL_TABLET | Freq: Every day | ORAL | 3 refills | Status: DC

## 2017-09-13 MED ORDER — AMLODIPINE BESYLATE 10 MG PO TABS: ORAL_TABLET | ORAL | 1 refills | Status: DC

## 2017-09-13 NOTE — Progress Notes (Signed)
MRN: [id number] DOB: [birth year]  Subjective:   Jeffrey Harrington is a 57 y.o. male presenting for follow up on Hypertension. Currently managed with losartan-hctz 50-12.48m and amlodipine 130mdaily. Patient is checking blood pressure at home, range is 14409Wystolic. Reports he is tolerating medications fine. No side effects. Denies lightheadedness, dizziness, chronic headache, double vision, chest pain, shortness of breath, heart racing, palpitations, nausea, vomiting, abdominal pain, hematuria, lower leg swelling. Lifestyle: Avoiding excessive salt intake. Trying to exercise on a regular basis.   He smokes about half a pack a day.  He drinks about 2 alcoholic beverages per day.  Diet consists of pop out for breakfast, salad and sandwich for lunch, and meat and vegetables for dinner.  He drinks mostly water,excess sodas and sweet teas.  He does not perform structured exercise but notes his job avoids is very strenuous and requires him to lift heavy weights every day. Due to ASCVD risk last year, he was started on lipitor. Took lipitor for about 3 months but then stopped and did not pick up a refill because he did not know he was supposed to. Tolerated medication well. Denies any other aggravating or relieving factors, no other questions or concerns.  [place] a current medication list which includes the following prescription(s): amlodipine, atorvastatin, blood pressure kit, tamsulosin, and losartan-hydrochlorothiazide, and the following Facility-Administered Medications: sodium chloride. Also has No Known Allergies.  [place] a past medical history of Allergy, Hypertension, Personal history of colonic adenomas (06/07/2012), [place]), and Syncope (2008). Also  has a past surgical history that includes Hemorrhoid surgery; Inguinal hernia repair (Right); Colonoscopy (2014, 2017); Hemorrhoid banding (2014); and Prostate biopsy.   Objective:   Vitals: BP (!) 160/80   Pulse 93    Temp 99 F (37.2 C) (Oral)   Resp 16   Ht 5' 10.47" (1.79 m)   Wt 207 lb (93.9 kg)   SpO2 98%   BMI 29.30 kg/m   Physical Exam  Constitutional: He is oriented to person, place, and time. He appears well-developed and well-nourished. No distress.  HENT:  Head: Normocephalic and atraumatic.  Mouth/Throat: Uvula is midline, oropharynx is clear and moist and mucous membranes are normal. No tonsillar exudate.  Eyes: Pupils are equal, round, and reactive to light. Conjunctivae and EOM are normal.  Neck: Normal range of motion.  Cardiovascular: Normal rate, regular rhythm, normal heart sounds and intact distal pulses.  Pulmonary/Chest: Effort normal and breath sounds normal. He has no decreased breath sounds. He has no wheezes. He has no rhonchi. He has no rales.  Musculoskeletal:       Right lower leg: He exhibits no swelling.       Left lower leg: He exhibits no swelling.  Neurological: He is alert and oriented to person, place, and time.  Skin: Skin is warm and dry.  Psychiatric: He has a normal mood and affect.  Vitals reviewed.    BP Readings from Last 3 Encounters:  09/13/17 (!) 160/80  05/11/17 (!) 172/92  01/22/17 130/80    No results found for this or any previous visit (from the past 24 hour(s)).  Assessment and Plan :  1. Essential hypertension Uncontrolled today. He is asx. Labs pending. Recommend increasing losartan component of hyzaar and continuing with other medications. Check bp outside office. Goal is <140/90. Discussed lifestyle modifications. F/u in 4 weeks, if still elevated >140/90, will increase HCTZ component. Given strict ED precautions.  - losartan-hydrochlorothiazide (HYZAAR) 100-12.5  MG tablet; Take 1 tablet by mouth daily.  Dispense: 90 tablet; Refill: 1 - CBC with Differential/Platelet - CMP14+EGFR - TSH - Urinalysis, dipstick only - amLODipine (NORVASC) 10 MG tablet; TAKE 1 TABLET BY MOUTH EVERY DAY  Dispense: 90 tablet; Refill: 1  2. Tobacco  use Discussed smoking cessation. Pt is not ready but would like educational material and will discuss this with his friends.   3. 10 year risk of MI or stroke 7.5% or greater Labs pending. Encouraged him to take lipitor daily.  - Lipid panel - atorvastatin (LIPITOR) 20 MG tablet; Take 1 tablet (20 mg total) by mouth daily.  Dispense: 90 tablet; Refill: [place], PA-C  [facility] 09/13/2017 5:02 PM

## 2017-09-13 NOTE — Patient Instructions (Addendum)
In terms of elevated blood pressure, I would like you to check your blood pressure at least a couple times over the next week outside of the office and document these values. It is best if you check the blood pressure at different times in the day. Your goal is <140/90. If your values are consistently above this goal, please return to office for further evaluation. Otherwise, follow up in 4 weeks.  If you start to have chest pain, blurred vision, shortness of breath, severe headache, lower leg swelling, or nausea/vomiting please seek care immediately here or at the ED.    What are the benefits of quitting smoking? - Quitting smoking can lower your chances of getting or dying from heart disease, lung disease, kidney failure, infection, or cancer. It can also lower your chances of getting osteoporosis, a condition that makes your bones weak. Plus, quitting smoking can help your skin look younger and reduce the chances that you will have problems with sex.  Quitting smoking will improve your health no matter how old you are, and no matter how long or how much you have smoked.  What should I do if I want to quit smoking? - The letters in the word "START" can help you remember the steps to take:  S = Set a quit date.  T = Tell family, friends, and the people around you that you plan to quit.  A = Anticipate or plan ahead for the tough times you'll face while quitting.  R = Remove cigarettes and other tobacco products from your home, car, and work.  T = Talk to your doctor about getting help to quit.  How can my doctor or nurse help? - Your doctor or nurse can give you advice on the best way to quit. He or she can also put you in touch with counselors or other people you can call for support. Plus, your doctor or nurse can give you medicines to:  - Reduce your craving for cigarettes - Reduce the unpleasant symptoms that happen when you stop smoking (called "withdrawal symptoms").  You can also get  help from a free phone line (1-800-QUIT-NOW) or go online to [place]Info.fr.  What are the symptoms of withdrawal? - The symptoms include:  - Trouble sleeping - Being irritable, anxious or restless - Getting frustrated or angry - Having trouble thinking clearly Some people who stop smoking become temporarily depressed. Some of them need treatment for depression, such as counseling or antidepressant medicines. If you get depressed when you quit smoking, tell your doctor or nurse about it.  How do medicines help? - Different medicines work in different ways:  - Nicotine replacement therapy eases withdrawal and reduces your body's craving for nicotine, the main drug found in cigarettes. Non-prescription forms of nicotine replacement include skin patches, lozenges, and gum. Prescription forms include nasal sprays and "puffers" or inhalers. - Bupropion is a prescription medicine that reduces your desire to smoke. This medicine is sold under the brand names Zyban and Wellbutrin. It is also available in a generic version, which is cheaper than brand-name medicines.  - Varenicline (brand name: Chantix) is a prescription medicine that reduces withdrawal symptoms and cigarette cravings. If you think you'd like to take varenicline and you have a history of depression, anxiety, or heart disease, discuss this with your doctor or nurse before taking the medicine. Varenicline can also increase the effects of alcohol in some people. It's a good idea to limit drinking while you're taking it, at  least until you know how it affects you. If you take bupropion or varenicline and you have any of the following symptoms, stop taking the medicine and call your doctor or nurse:  ? Become very nervous ? Become depressed ? Start to do strange things ? Think about killing yourself  How does counseling work? - Counseling can happen during formal office visits or just over the phone. A counselor can help you:  ? Figure  out what triggers your smoking and what to do instead ? Overcome cravings ? Figure out what went wrong when you tried to quit before  What works best? - Studies show that people have the best luck at quitting if they take medicines to help them quit and work with a Social worker. It might also be helpful to combine nicotine replacement with one of the prescription medicines that help people quit. In some cases, it might even make sense to take bupropion and varenicline together.    Will I gain weight if I quit? - Yes, you might gain a few pounds. But quitting smoking will have a much more positive effect on your health than weighing a few pounds more. Plus, you can help prevent some weight gain by being more active and eating less. Taking the medicine bupropion might help control weight gain.  What else can I do to improve my chances of quitting? - You can:  ? Start exercising. ? Stay away from smokers and places that you associate with smoking. If people close to you smoke, ask them to quit with you. ? Keep gum, hard candy, or something to put in your mouth handy. If you get a craving for a cigarette, try one of these instead. ? Don't give up, even if you start smoking again. It takes most people a few tries before they succeed.    Steps to Quit Smoking Smoking tobacco can be bad for your health. It can also affect almost every organ in your body. Smoking puts you and people around you at risk for many serious long-lasting (chronic) diseases. Quitting smoking is hard, but it is one of the best things that you can do for your health. It is never too late to quit. What are the benefits of quitting smoking? When you quit smoking, you lower your risk for getting serious diseases and conditions. They can include:  Lung cancer or lung disease.  Heart disease.  Stroke.  Heart attack.  Not being able to have children (infertility).  Weak bones (osteoporosis) and broken bones (fractures).  If  you have coughing, wheezing, and shortness of breath, those symptoms may get better when you quit. You may also get sick less often. If you are pregnant, quitting smoking can help to lower your chances of having a baby of low birth weight. What can I do to help me quit smoking? Talk with your doctor about what can help you quit smoking. Some things you can do (strategies) include:  Quitting smoking totally, instead of slowly cutting back how much you smoke over a period of time.  Going to in-person counseling. You are more likely to quit if you go to many counseling sessions.  Using resources and support systems, such as: ? Database administrator with a Social worker. ? Phone quitlines. ? Careers information officer. ? Support groups or group counseling. ? Text messaging programs. ? Mobile phone apps or applications.  Taking medicines. Some of these medicines may have nicotine in them. If you are pregnant or breastfeeding, do not  take any medicines to quit smoking unless your doctor says it is okay. Talk with your doctor about counseling or other things that can help you.  Talk with your doctor about using more than one strategy at the same time, such as taking medicines while you are also going to in-person counseling. This can help make quitting easier. What things can I do to make it easier to quit? Quitting smoking might feel very hard at first, but there is a lot that you can do to make it easier. Take these steps:  Talk to your family and friends. Ask them to support and encourage you.  Call phone quitlines, reach out to support groups, or work with a Social worker.  Ask people who smoke to not smoke around you.  Avoid places that make you want (trigger) to smoke, such as: ? Bars. ? Parties. ? Smoke-break areas at work.  Spend time with people who do not smoke.  Lower the stress in your life. Stress can make you want to smoke. Try these things to help your stress: ? Getting regular  exercise. ? Deep-breathing exercises. ? Yoga. ? Meditating. ? Doing a body scan. To do this, close your eyes, focus on one area of your body at a time from head to toe, and notice which parts of your body are tense. Try to relax the muscles in those areas.  Download or buy apps on your mobile phone or tablet that can help you stick to your quit plan. There are many free apps, such as QuitGuide from the [employer] Office manager for Disease Control and Prevention). You can find more support from smokefree.gov and other websites.  This information is not intended to replace advice given to you by your health care provider. Make sure you discuss any questions you have with your health care provider. Document Released: 01/02/2009 Document Revised: 11/04/2015 Document Reviewed: 07/23/2014 Elsevier Interactive Patient Education  2018 [employer].     IF you received an x-ray today, you will receive an invoice from [facility]. Please contact [facility] at [phone] with questions or concerns regarding your invoice.   IF you received labwork today, you will receive an invoice from [facility]. Please contact [facility] at [phone] with questions or concerns regarding your invoice.   Our billing staff will not be able to assist you with questions regarding bills from these companies.  You will be contacted with the lab results as soon as they are available. The fastest way to get your results is to activate your My Chart account. Instructions are located on the last page of this paperwork. If you have not heard from [place] regarding the results in 2 weeks, please contact this office.

## 2017-09-14 LAB — CBC WITH DIFFERENTIAL/PLATELET
BASOS ABS: 0 10*3/uL (ref 0.0–0.2)
Basos: 1 %
EOS (ABSOLUTE): 0.1 10*3/uL (ref 0.0–0.4)
EOS: 1 %
Hematocrit: 40.9 % (ref 37.5–51.0)
Hemoglobin: 13.5 g/dL (ref 13.0–17.7)
Immature Grans (Abs): 0 10*3/uL (ref 0.0–0.1)
Immature Granulocytes: 0 %
Lymphocytes Absolute: 1.8 10*3/uL (ref 0.7–3.1)
Lymphs: 35 %
MCH: 27.4 pg (ref 26.6–33.0)
MCHC: 33 g/dL (ref 31.5–35.7)
MCV: 83 fL (ref 79–97)
MONOS ABS: 0.7 10*3/uL (ref 0.1–0.9)
Monocytes: 14 %
NEUTROS PCT: 49 %
Neutrophils Absolute: 2.5 10*3/uL (ref 1.4–7.0)
PLATELETS: 256 10*3/uL (ref 150–450)
RBC: 4.93 x10E6/uL (ref 4.14–5.80)
RDW: 15.5 % — AB (ref 12.3–15.4)
WBC: 5.1 10*3/uL (ref 3.4–10.8)

## 2017-09-14 LAB — URINALYSIS, DIPSTICK ONLY
Bilirubin, UA: NEGATIVE
Glucose, UA: NEGATIVE
KETONES UA: NEGATIVE
Leukocytes, UA: NEGATIVE
Nitrite, UA: NEGATIVE
PH UA: 7 (ref 5.0–7.5)
Protein, UA: NEGATIVE
RBC, UA: NEGATIVE
Specific Gravity, UA: 1.014 (ref 1.005–1.030)
UUROB: 0.2 mg/dL (ref 0.2–1.0)

## 2017-09-14 LAB — CMP14+EGFR
A/G RATIO: 1.6 (ref 1.2–2.2)
ALT: 49 IU/L — AB (ref 0–44)
AST: 58 IU/L — AB (ref 0–40)
Albumin: 5.1 g/dL (ref 3.5–5.5)
Alkaline Phosphatase: 67 IU/L (ref 39–117)
BILIRUBIN TOTAL: 0.4 mg/dL (ref 0.0–1.2)
BUN/Creatinine Ratio: 12 (ref 9–20)
BUN: 11 mg/dL (ref 6–24)
CHLORIDE: 97 mmol/L (ref 96–106)
CO2: 26 mmol/L (ref 20–29)
Calcium: 10.2 mg/dL (ref 8.7–10.2)
Creatinine, Ser: 0.92 mg/dL (ref 0.76–1.27)
GFR calc Af Amer: 106 mL/min/{1.73_m2} (ref >59)
GFR calc non Af Amer: 91 mL/min/{1.73_m2} (ref >59)
GLUCOSE: 101 mg/dL — AB (ref 65–99)
Globulin, Total: 3.1 g/dL (ref 1.5–4.5)
POTASSIUM: 4.6 mmol/L (ref 3.5–5.2)
Sodium: 140 mmol/L (ref 134–144)
Total Protein: 8.2 g/dL (ref 6.0–8.5)

## 2017-09-14 LAB — LIPID PANEL
Chol/HDL Ratio: 2.7 ratio (ref 0.0–5.0)
Cholesterol, Total: 224 mg/dL — ABNORMAL HIGH (ref 100–199)
HDL: 83 mg/dL (ref >39)
LDL Calculated: 118 mg/dL — ABNORMAL HIGH (ref 0–99)
TRIGLYCERIDES: 115 mg/dL (ref 0–149)
VLDL Cholesterol Cal: 23 mg/dL (ref 5–40)

## 2017-09-14 LAB — TSH: TSH: 2.24 u[IU]/mL (ref 0.450–4.500)

## 2017-09-15 ENCOUNTER — Other Ambulatory Visit: Payer: Self-pay | Admitting: Physician Assistant

## 2017-09-15 DIAGNOSIS — R748 Abnormal levels of other serum enzymes: Secondary | ICD-10-CM

## 2017-09-15 NOTE — Progress Notes (Signed)
Orders Placed This Encounter  Procedures  . Hepatitis B surface antigen    Standing Status:   Future    Standing Expiration Date:   03/17/2018  . Hepatitis B core antibody, IgM    Standing Status:   Future    Standing Expiration Date:   03/17/2018  . Hepatitis B surface antibody    Standing Status:   Future    Standing Expiration Date:   03/17/2018  . Hepatitis C antibody    Standing Status:   Future    Standing Expiration Date:   03/17/2018  . Hepatic Function Panel    Standing Status:   Future    Standing Expiration Date:   03/17/2018  . Iron, TIBC and Ferritin Panel    Standing Status:   Future    Standing Expiration Date:   03/17/2018

## 2017-09-17 ENCOUNTER — Other Ambulatory Visit: Payer: Self-pay | Admitting: Physician Assistant

## 2017-09-17 DIAGNOSIS — I10 Essential (primary) hypertension: Secondary | ICD-10-CM

## 2017-10-14 NOTE — Progress Notes (Signed)
MRN: [id number] DOB: [birth year]  Subjective:   Jeffrey Harrington is a 57 y.o. male presenting for follow up on Hypertension. Last seen in office in 08/2017. Medication was increased. CMP showed elevated liver enzymes. Currently managed with amlodipine 36m, hyzaar 100-12.543mdaily. Patient is checking blood pressure at home, [place] [address]353Gystolic.Denies lightheadedness, dizziness, chronic headache, double vision, chest pain, shortness of breath, heart racing, palpitations, nausea, vomiting, abdominal pain, hematuria, lower leg swelling. Lifestyle:Eats red meat.  Avoiding excessive salt intake. Trying to exercise on a regular basis, walks daily, 10,000 steps. Still smoking 0.5ppd, not ready to quit, going to think about it. Drinks alcohol socalliy. Denies any other aggravating or relieving factors, no other questions or concerns.  [name] a current medication list which includes the following prescription(s): amlodipine, atorvastatin, blood pressure kit, losartan-hydrochlorothiazide, tamsulosin, and hydrochlorothiazide, and the following Facility-Administered Medications: sodium chloride. Also has No Known Allergies.  [place] a past medical history of Allergy, Hypertension, Personal history of colonic adenomas (06/07/2012), Prostate cancer ([place](11/09/2016), and Syncope (2008). Also  has a past surgical history that includes Hemorrhoid surgery; Inguinal hernia repair (Right); Colonoscopy (2014, 2017); Hemorrhoid banding (2014); and Prostate biopsy.   Objective:   Vitals: BP (!) 155/87 (BP Location: Left Arm, Patient Position: Sitting, Cuff Size: Normal)   Pulse 98   Temp 97.9 F (36.6 C) (Oral)   Ht _0  (1.803 m)   Wt 209 lb 12.8 oz (95.2 kg)   SpO2 100%   BMI 29.26 kg/m   Physical Exam  Constitutional: He is oriented to person, place, and time. He appears well-developed and well-nourished. No distress.  HENT:  Head: Normocephalic and atraumatic.  Mouth/Throat: Uvula is  midline, oropharynx is clear and moist and mucous membranes are normal. No tonsillar exudate.  Eyes: Pupils are equal, round, and reactive to light. Conjunctivae and EOM are normal.  Neck: Normal range of motion.  Cardiovascular: Normal rate, regular rhythm, normal heart sounds and intact distal pulses.  Pulmonary/Chest: Effort normal and breath sounds normal. He has no decreased breath sounds. He has no wheezes. He has no rhonchi. He has no rales.  Musculoskeletal:       Right lower leg: He exhibits no swelling.       Left lower leg: He exhibits no swelling.  Neurological: He is alert and oriented to person, place, and time.  Skin: Skin is warm and dry.  Psychiatric: He has a normal mood and affect.  Vitals reviewed.   No results found for this or any previous visit (from the past 24 hour(s)).  BP Readings from Last 3 Encounters:  10/15/17 (!) 155/87  09/13/17 (!) 160/80  05/11/17 (!) 172/92    Assessment and Plan :  1. Essential hypertension Home bp recordings better than in office readings. He brought his home bp and it read similar readings to our in office cuff. BP goal is 130/80. Rec increasing HCTZ component at this time so he will be on max ARB, HCTZ, and amlodipine. Encouraged dec red meat in diet and dec cigarettes smoked per day. Advised to f/u if bp consistently above range, otherwise, in 3 months.  - hydrochlorothiazide (HYDRODIURIL) 12.5 MG tablet; Take 1 tablet (12.5 mg total) by mouth daily.  Dispense: 90 tablet; Refill: 0  2. Elevated liver enzymes Labs pending.  - Iron, TIBC and Ferritin Panel - Hepatic Function Panel - Hepatitis C antibody - Hepatitis B surface antibody - Hepatitis B core antibody, IgM - Hepatitis  B surface antigen  3. Tobacco use In contemplation stage.    Tenna Delaine, PA-C  [facility] 10/15/2017 10:16 AM

## 2017-10-15 ENCOUNTER — Encounter: Payer: Self-pay | Admitting: Physician Assistant

## 2017-10-15 ENCOUNTER — Other Ambulatory Visit: Payer: Self-pay

## 2017-10-15 ENCOUNTER — Ambulatory Visit: Payer: 59 | Admitting: Physician Assistant

## 2017-10-15 VITALS — BP 155/87 | HR 98 | Temp 97.9°F | Ht 71.0 in | Wt 209.8 lb

## 2017-10-15 DIAGNOSIS — Z720 Tobacco use: Secondary | ICD-10-CM | POA: Diagnosis not present

## 2017-10-15 DIAGNOSIS — R748 Abnormal levels of other serum enzymes: Secondary | ICD-10-CM | POA: Diagnosis not present

## 2017-10-15 DIAGNOSIS — I10 Essential (primary) hypertension: Secondary | ICD-10-CM

## 2017-10-15 MED ORDER — LOSARTAN POTASSIUM-HCTZ 100-25 MG PO TABS: 1.0000 | ORAL_TABLET | Freq: Every day | ORAL | 1 refills | Status: DC

## 2017-10-15 MED ORDER — HYDROCHLOROTHIAZIDE 12.5 MG PO TABS: 12.5000 mg | ORAL_TABLET | Freq: Every day | ORAL | 0 refills | Status: DC

## 2017-10-15 NOTE — Patient Instructions (Addendum)
Continue with current medications. I have added on HCTZ 12.5 mg to start along with hyzaar and norvasc. Continue checking blood pressure at home. Goal is 130/80. If consistently over this goal, return for reevaluation. Otherwise, follow up in 3 months. Thank you for letting me participate in your health and well being.  DASH Eating Plan DASH stands for "Dietary Approaches to Stop Hypertension." The DASH eating plan is a healthy eating plan that has been shown to reduce high blood pressure (hypertension). It may also reduce your risk for type 2 diabetes, heart disease, and stroke. The DASH eating plan may also help with weight loss. What are tips for following this plan? General guidelines  Avoid eating more than 2,300 mg (milligrams) of salt (sodium) a day. If you have hypertension, you may need to reduce your sodium intake to 1,500 mg a day.  Limit alcohol intake to no more than 1 drink a day for nonpregnant women and 2 drinks a day for men. One drink equals 12 oz of beer, 5 oz of wine, or 1 oz of hard liquor.  Work with your health care provider to maintain a healthy body weight or to lose weight. Ask what an ideal weight is for you.  Get at least 30 minutes of exercise that causes your heart to beat faster (aerobic exercise) most days of the week. Activities may include walking, swimming, or biking.  Work with your health care provider or diet and nutrition specialist (dietitian) to adjust your eating plan to your individual calorie needs. Reading food labels  Check food labels for the amount of sodium per serving. Choose foods with less than 5 percent of the Daily Value of sodium. Generally, foods with less than 300 mg of sodium per serving fit into this eating plan.  To find whole grains, look for the word "whole" as the first word in the ingredient list. Shopping  Buy products labeled as "low-sodium" or "no salt added."  Buy fresh foods. Avoid canned foods and premade or frozen  meals. Cooking  Avoid adding salt when cooking. Use salt-free seasonings or herbs instead of table salt or sea salt. Check with your health care provider or pharmacist before using salt substitutes.  Do not fry foods. Cook foods using healthy methods such as baking, boiling, grilling, and broiling instead.  Cook with heart-healthy oils, such as olive, canola, soybean, or sunflower oil. Meal planning   Eat a balanced diet that includes: ? 5 or more servings of fruits and vegetables each day. At each meal, try to fill half of your plate with fruits and vegetables. ? Up to 6-8 servings of whole grains each day. ? Less than 6 oz of lean meat, poultry, or fish each day. A 3-oz serving of meat is about the same size as a deck of cards. One egg equals 1 oz. ? 2 servings of low-fat dairy each day. ? A serving of nuts, seeds, or beans 5 times each week. ? Heart-healthy fats. Healthy fats called Omega-3 fatty acids are found in foods such as flaxseeds and coldwater fish, like sardines, salmon, and mackerel.  Limit how much you eat of the following: ? Canned or prepackaged foods. ? Food that is high in trans fat, such as fried foods. ? Food that is high in saturated fat, such as fatty meat. ? Sweets, desserts, sugary drinks, and other foods with added sugar. ? Full-fat dairy products.  Do not salt foods before eating.  Try to eat at least 2 vegetarian  meals each week.  Eat more home-cooked food and less restaurant, buffet, and fast food.  When eating at a restaurant, ask that your food be prepared with less salt or no salt, if possible. What foods are recommended? The items listed may not be a complete list. Talk with your dietitian about what dietary choices are best for you. Grains Whole-grain or whole-wheat bread. Whole-grain or whole-wheat pasta. Brown rice. [place]. Bulgur. Whole-grain and low-sodium cereals. Pita bread. Low-fat, low-sodium crackers. Whole-wheat flour  tortillas. Vegetables Fresh or frozen vegetables (raw, steamed, roasted, or grilled). Low-sodium or reduced-sodium tomato and vegetable juice. Low-sodium or reduced-sodium tomato sauce and tomato paste. Low-sodium or reduced-sodium canned vegetables. Fruits All fresh, dried, or frozen fruit. Canned fruit in natural juice (without added sugar). Meat and other protein foods Skinless chicken or [place]. Ground chicken or [place]. Pork with fat trimmed off. Fish and seafood. Egg whites. Dried beans, peas, or lentils. Unsalted nuts, nut butters, and seeds. Unsalted canned beans. Lean cuts of beef with fat trimmed off. Low-sodium, lean deli meat. Dairy Low-fat (1%) or fat-free (skim) milk. Fat-free, low-fat, or reduced-fat cheeses. Nonfat, low-sodium ricotta or cottage cheese. Low-fat or nonfat yogurt. Low-fat, low-sodium cheese. Fats and oils Soft margarine without trans fats. Vegetable oil. Low-fat, reduced-fat, or light mayonnaise and salad dressings (reduced-sodium). Canola, safflower, olive, soybean, and sunflower oils. Avocado. Seasoning and other foods Herbs. Spices. Seasoning mixes without salt. Unsalted popcorn and pretzels. Fat-free sweets. What foods are not recommended? The items listed may not be a complete list. Talk with your dietitian about what dietary choices are best for you. Grains Baked goods made with fat, such as croissants, muffins, or some breads. Dry pasta or rice meal packs. Vegetables Creamed or fried vegetables. Vegetables in a cheese sauce. Regular canned vegetables (not low-sodium or reduced-sodium). Regular canned tomato sauce and paste (not low-sodium or reduced-sodium). Regular tomato and vegetable juice (not low-sodium or reduced-sodium). Angie Fava. Olives. Fruits Canned fruit in a light or heavy syrup. Fried fruit. Fruit in cream or butter sauce. Meat and other protein foods Fatty cuts of meat. Ribs. Fried meat. Berniece Salines. Sausage. Bologna and other processed lunch meats.  Salami. Fatback. Hotdogs. Bratwurst. Salted nuts and seeds. Canned beans with added salt. Canned or smoked fish. Whole eggs or egg yolks. Chicken or [place] with skin. Dairy Whole or 2% milk, cream, and half-and-half. Whole or full-fat cream cheese. Whole-fat or sweetened yogurt. Full-fat cheese. Nondairy creamers. Whipped toppings. Processed cheese and cheese spreads. Fats and oils Butter. Stick margarine. Lard. Shortening. Ghee. Bacon fat. Tropical oils, such as coconut, palm kernel, or palm oil. Seasoning and other foods Salted popcorn and pretzels. Onion salt, garlic salt, seasoned salt, table salt, and sea salt. Worcestershire sauce. Tartar sauce. Barbecue sauce. Teriyaki sauce. Soy sauce, including reduced-sodium. Steak sauce. Canned and packaged gravies. Fish sauce. Oyster sauce. Cocktail sauce. Horseradish that you find on the shelf. Ketchup. Mustard. Meat flavorings and tenderizers. Bouillon cubes. Hot sauce and Tabasco sauce. Premade or packaged marinades. Premade or packaged taco seasonings. Relishes. Regular salad dressings. Where to find more information:  National Heart, Lung, and [place]: https://wilson-eaton.com/  American Heart Association: www.heart.org Summary  The DASH eating plan is a healthy eating plan that has been shown to reduce high blood pressure (hypertension). It may also reduce your risk for type 2 diabetes, heart disease, and stroke.  With the DASH eating plan, you should limit salt (sodium) intake to 2,300 mg a day. If you have hypertension, you may need to  reduce your sodium intake to 1,500 mg a day.  When on the DASH eating plan, aim to eat more fresh fruits and vegetables, whole grains, lean proteins, low-fat dairy, and heart-healthy fats.  Work with your health care provider or diet and nutrition specialist (dietitian) to adjust your eating plan to your individual calorie needs. This information is not intended to replace advice given to you by your health  care provider. Make sure you discuss any questions you have with your health care provider. Document Released: 02/25/2011 Document Revised: 03/01/2016 Document Reviewed: 03/01/2016 Elsevier Interactive Patient Education  2018 [employer].    IF you received an x-ray today, you will receive an invoice from [facility]. Please contact [facility] at [phone] with questions or concerns regarding your invoice.   IF you received labwork today, you will receive an invoice from [place]. Please contact [facility] at [phone] with questions or concerns regarding your invoice.   Our billing staff will not be able to assist you with questions regarding bills from these companies.  You will be contacted with the lab results as soon as they are available. The fastest way to get your results is to activate your My Chart account. Instructions are located on the last page of this paperwork. If you have not heard from [place] regarding the results in 2 weeks, please contact this office.

## 2017-10-16 LAB — HEPATITIS C ANTIBODY

## 2017-10-16 LAB — HEPATIC FUNCTION PANEL
ALT: 42 IU/L (ref 0–44)
AST: 65 IU/L — ABNORMAL HIGH (ref 0–40)
Albumin: 4.9 g/dL (ref 3.5–5.5)
Alkaline Phosphatase: 67 IU/L (ref 39–117)
Bilirubin Total: 0.4 mg/dL (ref 0.0–1.2)
Bilirubin, Direct: 0.13 mg/dL (ref 0.00–0.40)
Total Protein: 7.7 g/dL (ref 6.0–8.5)

## 2017-10-16 LAB — IRON,TIBC AND FERRITIN PANEL
FERRITIN: 280 ng/mL (ref 30–400)
IRON: 98 ug/dL (ref 38–169)
Iron Saturation: 33 % (ref 15–55)
TIBC: 294 ug/dL (ref 250–450)
UIBC: 196 ug/dL (ref 111–343)

## 2017-10-16 LAB — HEPATITIS B CORE ANTIBODY, IGM: HEP B C IGM: NEGATIVE

## 2017-10-16 LAB — HEPATITIS B SURFACE ANTIBODY, QUANTITATIVE: HEPATITIS B SURF AB QUANT: 137 m[IU]/mL

## 2017-10-16 LAB — HEPATITIS B SURFACE ANTIGEN: HEP B S AG: NEGATIVE

## 2017-10-20 ENCOUNTER — Other Ambulatory Visit: Payer: Self-pay | Admitting: Physician Assistant

## 2017-10-20 DIAGNOSIS — R748 Abnormal levels of other serum enzymes: Secondary | ICD-10-CM

## 2017-10-20 NOTE — Progress Notes (Signed)
Orders Placed This Encounter  Procedures  . US Abdomen Limited RUQ    Standing Status:   Future    Standing Expiration Date:   12/21/2018    Order Specific Question:   Reason for Exam (SYMPTOM  OR DIAGNOSIS REQUIRED)    Answer:   elevated LFTs    Order Specific Question:   Preferred imaging location?    Answer:   External

## 2018-01-23 ENCOUNTER — Telehealth: Payer: Self-pay | Admitting: Physician Assistant

## 2018-01-23 NOTE — Telephone Encounter (Signed)
Copied from [place] [id number]. Topic: Quick Communication - See Telephone Encounter >> Jan 23, 2018 10:02 AM Alfredia Ferguson R wrote: [facility] is calling in wanting to know if losartan-hydrochlorothiazide (HYZAAR) 100-12.5 MG tablet can be split up due to combined med being on backorder.

## 2018-01-24 ENCOUNTER — Other Ambulatory Visit: Payer: Self-pay | Admitting: Physician Assistant

## 2018-01-24 MED ORDER — LOSARTAN POTASSIUM 100 MG PO TABS: 100.0000 mg | ORAL_TABLET | Freq: Every day | ORAL | 3 refills | Status: DC

## 2018-01-24 MED ORDER — HYDROCHLOROTHIAZIDE 12.5 MG PO TABS: 12.5000 mg | ORAL_TABLET | Freq: Every day | ORAL | 3 refills | Status: DC

## 2018-01-24 NOTE — Telephone Encounter (Signed)
[facility] called back in to check on the status?

## 2018-01-24 NOTE — Progress Notes (Unsigned)
Please call pharmacy and let them know I have sent in a Rx for the meds separately.  Also, please call pt and let him know this. He is supposed to be on losartan 100mg  and HCTZ 25mg . The prescription I sent in was for 12.5mg  so he will need to take 2.

## 2018-01-25 ENCOUNTER — Telehealth: Payer: Self-pay | Admitting: *Deleted

## 2018-01-25 ENCOUNTER — Other Ambulatory Visit: Payer: Self-pay | Admitting: Physician Assistant

## 2018-01-25 DIAGNOSIS — I10 Essential (primary) hypertension: Secondary | ICD-10-CM

## 2018-01-25 NOTE — Telephone Encounter (Signed)
Please advise patient   He is supposed to be on losartan 100mg  and HCTZ 25mg . The prescription I sent in was for 12.5mg  so he will need to take 2.

## 2018-04-08 ENCOUNTER — Other Ambulatory Visit: Payer: Self-pay | Admitting: Physician Assistant

## 2018-04-08 DIAGNOSIS — I10 Essential (primary) hypertension: Secondary | ICD-10-CM

## 2018-04-10 NOTE — Telephone Encounter (Signed)
Refill request for amlodipine; last pt office 10/15/2017 with Piedad Climes; no upcoming visits noted; left message of pt's voicemail [phone].     Requested Prescriptions  Pending Prescriptions Disp Refills  . amLODipine (NORVASC) 10 MG tablet [Pharmacy Med Name: AMLODIPINE BESYLATE 10MG  TABLETS] 30 tablet 0    Sig: TAKE 1 TABLET BY MOUTH EVERY DAY     Cardiovascular:  Calcium Channel Blockers Failed - 04/08/2018 10:04 AM      Failed - Last BP in normal range    BP Readings from Last 1 Encounters:  10/15/17 (!) 155/87         Passed - Valid encounter within last 6 months    Recent Outpatient Visits          5 months ago Essential hypertension   [facility] [place], [place] D, PA-C   6 months ago Essential hypertension   [facility] [place], [place] D, PA-C   1 year ago Essential hypertension   [facility] [place], [place] D, PA-C   2 years ago History of elevated PSA   [facility], Carroll Sage, FNP   3 years ago Essential hypertension   [facility] at [facility], Gay Filler, MD

## 2018-05-18 ENCOUNTER — Other Ambulatory Visit: Payer: Self-pay | Admitting: Family Medicine

## 2018-05-18 DIAGNOSIS — I10 Essential (primary) hypertension: Secondary | ICD-10-CM

## 2018-07-07 ENCOUNTER — Other Ambulatory Visit: Payer: Self-pay | Admitting: Physician Assistant

## 2018-07-07 DIAGNOSIS — I10 Essential (primary) hypertension: Secondary | ICD-10-CM

## 2018-07-07 DIAGNOSIS — Z9189 Other specified personal risk factors, not elsewhere classified: Secondary | ICD-10-CM

## 2018-07-07 NOTE — Telephone Encounter (Signed)
Requested medication (s) are due for refill today: yes  Requested medication (s) are on the active medication list: yes  Last refill: 04/10/2018  Future visit scheduled:No  Notes to clinic:  Needs office visit for refill.    Requested Prescriptions  Pending Prescriptions Disp Refills   amLODipine (NORVASC) 10 MG tablet 30 tablet 0    Sig: Take 1 tablet (10 mg total) by mouth daily.     Cardiovascular:  Calcium Channel Blockers Failed - 07/07/2018 10:53 AM      Failed - Last BP in normal range    BP Readings from Last 1 Encounters:  10/15/17 (!) 155/87         Failed - Valid encounter within last 6 months    Recent Outpatient Visits          8 months ago Essential hypertension   [facility] [place], [place] D, PA-C   9 months ago Essential hypertension   [facility] [place], [place] D, PA-C   1 year ago Essential hypertension   [facility] [place], [place] D, PA-C   2 years ago History of elevated PSA   [facility], Carroll Sage, FNP   3 years ago Essential hypertension   [facility] at [facility], Gay Filler, MD

## 2018-08-07 ENCOUNTER — Other Ambulatory Visit: Payer: Self-pay

## 2018-08-07 ENCOUNTER — Telehealth (INDEPENDENT_AMBULATORY_CARE_PROVIDER_SITE_OTHER): Payer: 59 | Admitting: Family Medicine

## 2018-08-07 DIAGNOSIS — R945 Abnormal results of liver function studies: Secondary | ICD-10-CM

## 2018-08-07 DIAGNOSIS — Z9189 Other specified personal risk factors, not elsewhere classified: Secondary | ICD-10-CM

## 2018-08-07 DIAGNOSIS — F17210 Nicotine dependence, cigarettes, uncomplicated: Secondary | ICD-10-CM

## 2018-08-07 DIAGNOSIS — R7989 Other specified abnormal findings of blood chemistry: Secondary | ICD-10-CM

## 2018-08-07 DIAGNOSIS — F1010 Alcohol abuse, uncomplicated: Secondary | ICD-10-CM

## 2018-08-07 DIAGNOSIS — I10 Essential (primary) hypertension: Secondary | ICD-10-CM

## 2018-08-07 MED ORDER — ATORVASTATIN CALCIUM 20 MG PO TABS: 20.0000 mg | ORAL_TABLET | Freq: Every day | ORAL | 3 refills | Status: DC

## 2018-08-07 MED ORDER — LOSARTAN POTASSIUM-HCTZ 100-25 MG PO TABS: 1.0000 | ORAL_TABLET | Freq: Every day | ORAL | 1 refills | Status: DC

## 2018-08-07 MED ORDER — AMLODIPINE BESYLATE 10 MG PO TABS: 10.0000 mg | ORAL_TABLET | Freq: Every day | ORAL | 1 refills | Status: DC

## 2018-08-07 NOTE — Patient Instructions (Addendum)
New dose of losartan hct should help with blood pressure. Also restart the amlodipine once per day. Keep a record of your blood pressures outside of the office and bring them to the next office visit in 4 weeks.   Cutting back on alcohol should help with elevated liver tests as well as blood pressure, possibly letting us decrease dose of medication.  Try to cut back to no more than 2-3 drinks per day for now and we can discuss this further next visit.  If you do have difficulty cutting back, I did list some resources below.  Keep up the good work with cutting back on smoking.  There is some information below to help as well, but let me know if I can be of assistance.  No change in cholesterol medication for now.  We will check that with upcoming lab work.  Previous elevated liver tests will be rechecked this week, and can discuss further work-up if this remains elevated.    Good talking to you today.  I will see you in 4 weeks but lab work this week.  Resources for alcohol addiction if needed:  [facility]  Address: [address], [place], [place] [address]  Phone: [phone]  [facility]  Address: [address], [place], [place] [address]  Phone: [phone]  [facility] Address: [place], [place], [place] [address]  Phone: [phone]    Steps to Quit Smoking  Smoking tobacco can be harmful to your health and can affect almost every organ in your body. Smoking puts you, and those around you, at risk for developing many serious chronic diseases. Quitting smoking is difficult, but it is one of the best things that you can do for your health. It is never too late to quit. What are the benefits of quitting smoking? When you quit smoking, you lower your risk of developing serious diseases and conditions, such as:  Lung cancer or lung disease, such as COPD.  Heart disease.  Stroke.  Heart attack.  Infertility.  Osteoporosis and bone  fractures. Additionally, symptoms such as coughing, wheezing, and shortness of breath may get better when you quit. You may also find that you get sick less often because your body is stronger at fighting off colds and infections. If you are pregnant, quitting smoking can help to reduce your chances of having a baby of low birth weight. How do I get ready to quit? When you decide to quit smoking, create a plan to make sure that you are successful. Before you quit:  Pick a date to quit. Set a date within the next two weeks to give you time to prepare.  Write down the reasons why you are quitting. Keep this list in places where you will see it often, such as on your bathroom mirror or in your car or wallet.  Identify the people, places, things, and activities that make you want to smoke (triggers) and avoid them. Make sure to take these actions: ? Throw away all cigarettes at home, at work, and in your car. ? Throw away smoking accessories, such as Scientist, research (medical). ? Clean your car and make sure to empty the ashtray. ? Clean your home, including curtains and carpets.  Tell your family, friends, and coworkers that you are quitting. Support from your loved ones can make quitting easier.  Talk with your health care provider about your options for quitting smoking.  Find out what treatment options are covered  by your health insurance. What strategies can I use to quit smoking? Talk with your healthcare provider about different strategies to quit smoking. Some strategies include:  Quitting smoking altogether instead of gradually lessening how much you smoke over a period of time. Research shows that quitting "cold [place]" is more successful than gradually quitting.  Attending in-person counseling to help you build problem-solving skills. You are more likely to have success in quitting if you attend several counseling sessions. Even short sessions of 10 minutes can be effective.  Finding  resources and support systems that can help you to quit smoking and remain smoke-free after you quit. These resources are most helpful when you use them often. They can include: ? Online chats with a Social worker. ? Telephone quitlines. ? Careers information officer. ? Support groups or group counseling. ? Text messaging programs. ? Mobile phone applications.  Taking medicines to help you quit smoking. (If you are pregnant or breastfeeding, talk with your health care provider first.) Some medicines contain nicotine and some do not. Both types of medicines help with cravings, but the medicines that include nicotine help to relieve withdrawal symptoms. Your health care provider may recommend: ? Nicotine patches, gum, or lozenges. ? Nicotine inhalers or sprays. ? Non-nicotine medicine that is taken by mouth. Talk with your health care provider about combining strategies, such as taking medicines while you are also receiving in-person counseling. Using these two strategies together makes you more likely to succeed in quitting than if you used either strategy on its own. If you are pregnant or breastfeeding, talk with your health care provider about finding counseling or other support strategies to quit smoking. Do not take medicine to help you quit smoking unless told to do so by your health care provider. What things can I do to make it easier to quit? Quitting smoking might feel overwhelming at first, but there is a lot that you can do to make it easier. Take these important actions:  Reach out to your family and friends and ask that they support and encourage you during this time. Call telephone quitlines, reach out to support groups, or work with a counselor for support.  Ask people who smoke to avoid smoking around you.  Avoid places that trigger you to smoke, such as bars, parties, or smoke-break areas at work.  Spend time around people who do not smoke.  Lessen stress in your life, because  stress can be a smoking trigger for some people. To lessen stress, try: ? Exercising regularly. ? Deep-breathing exercises. ? Yoga. ? Meditating. ? Performing a body scan. This involves closing your eyes, scanning your body from head to toe, and noticing which parts of your body are particularly tense. Purposefully relax the muscles in those areas.  Download or purchase mobile phone or tablet apps (applications) that can help you stick to your quit plan by providing reminders, tips, and encouragement. There are many free apps, such as QuitGuide from the [employer] Office manager for Disease Control and Prevention). You can find other support for quitting smoking (smoking cessation) through smokefree.gov and other websites. How will I feel when I quit smoking? Within the first 24 hours of quitting smoking, you may start to feel some withdrawal symptoms. These symptoms are usually most noticeable 2-3 days after quitting, but they usually do not last beyond 2-3 weeks. Changes or symptoms that you might experience include:  Mood swings.  Restlessness, anxiety, or irritation.  Difficulty concentrating.  Dizziness.  Strong cravings for sugary  foods in addition to nicotine.  Mild weight gain.  Constipation.  Nausea.  Coughing or a sore throat.  Changes in how your medicines work in your body.  A depressed mood.  Difficulty sleeping (insomnia). After the first 2-3 weeks of quitting, you may start to notice more positive results, such as:  Improved sense of smell and taste.  Decreased coughing and sore throat.  Slower heart rate.  Lower blood pressure.  Clearer skin.  The ability to breathe more easily.  Fewer sick days. Quitting smoking is very challenging for most people. Do not get discouraged if you are not successful the first time. Some people need to make many attempts to quit before they achieve long-term success. Do your best to stick to your quit plan, and talk with your health  care provider if you have any questions or concerns. This information is not intended to replace advice given to you by your health care provider. Make sure you discuss any questions you have with your health care provider. Document Released: 03/02/2001 Document Revised: 10/12/2016 Document Reviewed: 07/23/2014 Elsevier Interactive Patient Education  2019 [employer].

## 2018-08-07 NOTE — Progress Notes (Signed)
Pt needs a 6 month follow-up for medication refills. Pt will also need labs in the future. He states he has no other concerns at this time.

## 2018-08-07 NOTE — Progress Notes (Signed)
Virtual Visit via Telephone Note  I connected with Jeffrey Harrington on 08/07/18 at 8:08 AM by telephone and verified that I am speaking with the correct person using two identifiers.   I discussed the limitations, risks, security and privacy concerns of performing an evaluation and management service by telephone and the availability of in person appointments. I also discussed with the patient that there may be a patient responsible charge related to this service. The patient expressed understanding and agreed to proceed, consent obtained  Chief complaint: Medication refills  History of Present Illness: Jeffrey Harrington is a 58 y.o. male Previous patient of [place].   History of hypertension, hyperlipidemia, elevated LFT, hx of prostate CA.   Hypertension: BP Readings from Last 3 Encounters:  10/15/17 (!) 155/87  09/13/17 (!) 160/80  05/11/17 (!) 172/92   Lab Results  Component Value Date   CREATININE 0.92 09/13/2017  Last discussed in July 2019, at that time BP at home ranged 088-110 systolic.  Elevated in office reading.  HCTZ was increased for a total of 25 mg daily, losartan 100 mg daily, amlodipine 10 mg daily. Monitor was verified at last ov. BP elevated 162/108 at recent urology visit.   Home BP: 140/98?  Only taking 1 of the HCTZ 12.47m.  Ran out of amlodipine 2 weeks ago.  No new symptoms.   Hyperlipidemia:  Lab Results  Component Value Date   CHOL 224 (H) 09/13/2017   HDL 83 09/13/2017   LDLCALC 118 (H) 09/13/2017   TRIG 115 09/13/2017   CHOLHDL 2.7 09/13/2017   Lab Results  Component Value Date   ALT 42 10/15/2017   AST 65 (H) 10/15/2017   ALKPHOS 67 10/15/2017   BILITOT 0.4 10/15/2017  on atorvastatin once per day - no new side effects, no myalgias.   Tobacco use: Has cut back - 7 cigs per day.   Elevated liver function test: See prior visits.  AST range 46-65 most recently checked July 2019.  ALT is been normal with exception of 1 time in  June 2019 at 450  Normal hepatitis B and C testing.  Iron testing also okay.  Discussed ultrasound, but that has not been performed.  Alcohol: few beers and few shots liquor per day - about 6 per day.  Hx of DUI - 30 years ago.  No recent legal issues. No work issues, no family issues.  Alcohol addiction runs in family.        Patient Active Problem List   Diagnosis Date Noted   Malignant neoplasm of prostate ([facility] 12/21/2016   HTN (hypertension) 10/02/2013   Personal history of colonic adenomas 06/07/2012   Past Medical History:  Diagnosis Date   Allergy    Hypertension    Personal history of colonic adenomas 06/07/2012   05/2012 - 2 adenomas max 10 mm   Prostate cancer ([facility] 11/09/2016   Syncope 2008   Past Surgical History:  Procedure Laterality Date   COLONOSCOPY  2014, 2017   polyps   HEMORRHOID BANDING  2014   HEMORRHOID SURGERY     INGUINAL HERNIA REPAIR Right    PROSTATE BIOPSY     No Known Allergies Prior to Admission medications   Medication Sig Start Date End Date Taking? Authorizing Provider  amLODipine (NORVASC) 10 MG tablet TAKE 1 TABLET BY MOUTH EVERY DAY 04/10/18  Yes SRutherford Guys MD  atorvastatin (LIPITOR) 20 MG tablet Take 1 tablet (20 mg total) by mouth daily. 09/13/17  Yes Leonie Douglas, PA-C  Blood Pressure Monitoring (BLOOD PRESSURE KIT) DEVI 1 Units by Does not apply route daily. 01/22/17  Yes Timmothy Euler, [place] D, PA-C  hydrochlorothiazide (HYDRODIURIL) 12.5 MG tablet Take 1 tablet (12.5 mg total) by mouth daily. 01/24/18  Yes Timmothy Euler, [place] D, PA-C  losartan (COZAAR) 100 MG tablet Take 1 tablet (100 mg total) by mouth daily. 01/24/18  Yes Tenna Delaine D, PA-C  tamsulosin (FLOMAX) 0.4 MG CAPS capsule  08/19/17  Yes [provider]   Social History   Socioeconomic History   Marital status: Married    Spouse name: Not on file   Number of children: 2   Years of education: Not on file   Highest education level:  Not on file  Occupational History   Occupation: Event organiser: [employer] resource strain: Not on file   Food insecurity:    Worry: Not on file    Inability: Not on file   Transportation needs:    Medical: Not on file    Non-medical: Not on file  Tobacco Use   Smoking status: Current Every Day Smoker    Packs/day: 0.50    Years: 39.00    Pack years: 19.50    Types: Cigarettes   Smokeless tobacco: Never Used  Substance and Sexual Activity   Alcohol use: Yes    Alcohol/week: 3.0 standard drinks    Types: 3 Standard drinks or equivalent per week    Comment: 2 per day   Drug use: No   Sexual activity: Yes    Partners: Female    Comment: with monogamous partner   Lifestyle   Physical activity:    Days per week: Not on file    Minutes per session: Not on file   Stress: Not on file  Relationships   Social connections:    Talks on phone: Not on file    Gets together: Not on file    Attends religious service: Not on file    Active member of club or organization: Not on file    Attends meetings of clubs or organizations: Not on file    Relationship status: Not on file   Intimate partner violence:    Fear of current or ex partner: Not on file    Emotionally abused: Not on file    Physically abused: Not on file    Forced sexual activity: Not on file  Other Topics Concern   Not on file  Social History Narrative   Warehouse worker - [employer]   Married, 1 son, 1 daughter     Observations/Objective: No distress, speaking normally.  Understanding expressed of plan.  Assessment and Plan: Elevated LFTs - Plan: Comprehensive metabolic panel  -Possibly related to alcohol consumption.  Discussed cutting back initially to approximately half of current use.  Additional resources provided if needed.  Can discuss further at in person appointment in 4 weeks  Essential hypertension - Plan: Comprehensive metabolic  panel, losartan-hydrochlorothiazide (HYZAAR) 100-25 MG tablet, amLODipine (NORVASC) 10 MG tablet  -Decreased control, but has only been on 12.5 mg of HCTZ.  Alcohol intake also likely contributing.  Change to losartan HCTZ combo 100/25 mg, continue amlodipine 10 mg, recheck in next 4 weeks with home readings.  10 year risk of MI or stroke 7.5% or greater - Plan: Lipid panel, atorvastatin (LIPITOR) 20 MG tablet  -Tolerating statin, less likely cause of elevated LFT, but can monitor.  Alcohol  abuse  -Family history of addiction.  Discussed resources if needed, plans to cut back on his own at this time.  Potential impact on health including blood pressure elevated LFTs were discussed  Cigarette nicotine dependence without complication  -Commended on cutting back, handout provided for further resources for complete cessation.  Follow Up Instructions: This week for labs, 4 weeks for in person visit  Patient Instructions  New dose of losartan hct should help with blood pressure. Also restart the amlodipine once per day. Keep a record of your blood pressures outside of the office and bring them to the next office visit in 4 weeks.   Cutting back on alcohol should help with elevated liver tests as well as blood pressure, possibly letting us decrease dose of medication.  Try to cut back to no more than 2-3 drinks per day for now and we can discuss this further next visit.  If you do have difficulty cutting back, I did list some resources below.  Keep up the good work with cutting back on smoking.  There is some information below to help as well, but let me know if I can be of assistance.  No change in cholesterol medication for now.  We will check that with upcoming lab work.  Previous elevated liver tests will be rechecked this week, and can discuss further work-up if this remains elevated.    Good talking to you today.  I will see you in 4 weeks but lab work this week.  Resources for alcohol  addiction if needed:  [facility]  Address: [address], [place], [place] [address]  Phone: [phone]  [facility]  Address: [address], [place], [place] [address]  Phone: [phone]  [facility] Address: [place], [place], [place] [address]  Phone: [phone]    Steps to Quit Smoking  Smoking tobacco can be harmful to your health and can affect almost every organ in your body. Smoking puts you, and those around you, at risk for developing many serious chronic diseases. Quitting smoking is difficult, but it is one of the best things that you can do for your health. It is never too late to quit. What are the benefits of quitting smoking? When you quit smoking, you lower your risk of developing serious diseases and conditions, such as:  Lung cancer or lung disease, such as COPD.  Heart disease.  Stroke.  Heart attack.  Infertility.  Osteoporosis and bone fractures. Additionally, symptoms such as coughing, wheezing, and shortness of breath may get better when you quit. You may also find that you get sick less often because your body is stronger at fighting off colds and infections. If you are pregnant, quitting smoking can help to reduce your chances of having a baby of low birth weight. How do I get ready to quit? When you decide to quit smoking, create a plan to make sure that you are successful. Before you quit:  Pick a date to quit. Set a date within the next two weeks to give you time to prepare.  Write down the reasons why you are quitting. Keep this list in places where you will see it often, such as on your bathroom mirror or in your car or wallet.  Identify the people, places, things, and activities that make you want to smoke (triggers) and avoid them. Make sure to take these actions: ? Throw away all cigarettes at home, at work, and in your car. ?  Throw away smoking accessories, such as Scientist, research (medical). ? Clean  your car and make sure to empty the ashtray. ? Clean your home, including curtains and carpets.  Tell your family, friends, and coworkers that you are quitting. Support from your loved ones can make quitting easier.  Talk with your health care provider about your options for quitting smoking.  Find out what treatment options are covered by your health insurance. What strategies can I use to quit smoking? Talk with your healthcare provider about different strategies to quit smoking. Some strategies include:  Quitting smoking altogether instead of gradually lessening how much you smoke over a period of time. Research shows that quitting cold [place] is more successful than gradually quitting.  Attending in-person counseling to help you build problem-solving skills. You are more likely to have success in quitting if you attend several counseling sessions. Even short sessions of 10 minutes can be effective.  Finding resources and support systems that can help you to quit smoking and remain smoke-free after you quit. These resources are most helpful when you use them often. They can include: ? Online chats with a Social worker. ? Telephone quitlines. ? Careers information officer. ? Support groups or group counseling. ? Text messaging programs. ? Mobile phone applications.  Taking medicines to help you quit smoking. (If you are pregnant or breastfeeding, talk with your health care provider first.) Some medicines contain nicotine and some do not. Both types of medicines help with cravings, but the medicines that include nicotine help to relieve withdrawal symptoms. Your health care provider may recommend: ? Nicotine patches, gum, or lozenges. ? Nicotine inhalers or sprays. ? Non-nicotine medicine that is taken by mouth. Talk with your health care provider about combining strategies, such as taking medicines while you are also receiving in-person counseling. Using these two strategies together makes  you more likely to succeed in quitting than if you used either strategy on its own. If you are pregnant or breastfeeding, talk with your health care provider about finding counseling or other support strategies to quit smoking. Do not take medicine to help you quit smoking unless told to do so by your health care provider. What things can I do to make it easier to quit? Quitting smoking might feel overwhelming at first, but there is a lot that you can do to make it easier. Take these important actions:  Reach out to your family and friends and ask that they support and encourage you during this time. Call telephone quitlines, reach out to support groups, or work with a counselor for support.  Ask people who smoke to avoid smoking around you.  Avoid places that trigger you to smoke, such as bars, parties, or smoke-break areas at work.  Spend time around people who do not smoke.  Lessen stress in your life, because stress can be a smoking trigger for some people. To lessen stress, try: ? Exercising regularly. ? Deep-breathing exercises. ? Yoga. ? Meditating. ? Performing a body scan. This involves closing your eyes, scanning your body from head to toe, and noticing which parts of your body are particularly tense. Purposefully relax the muscles in those areas.  Download or purchase mobile phone or tablet apps (applications) that can help you stick to your quit plan by providing reminders, tips, and encouragement. There are many free apps, such as QuitGuide from the [employer] Office manager for Disease Control and Prevention). You can find other support for quitting smoking (smoking cessation) through smokefree.gov and other websites.  How will I feel when I quit smoking? Within the first 24 hours of quitting smoking, you may start to feel some withdrawal symptoms. These symptoms are usually most noticeable 2-3 days after quitting, but they usually do not last beyond 2-3 weeks. Changes or symptoms that you  might experience include:  Mood swings.  Restlessness, anxiety, or irritation.  Difficulty concentrating.  Dizziness.  Strong cravings for sugary foods in addition to nicotine.  Mild weight gain.  Constipation.  Nausea.  Coughing or a sore throat.  Changes in how your medicines work in your body.  A depressed mood.  Difficulty sleeping (insomnia). After the first 2-3 weeks of quitting, you may start to notice more positive results, such as:  Improved sense of smell and taste.  Decreased coughing and sore throat.  Slower heart rate.  Lower blood pressure.  Clearer skin.  The ability to breathe more easily.  Fewer sick days. Quitting smoking is very challenging for most people. Do not get discouraged if you are not successful the first time. Some people need to make many attempts to quit before they achieve long-term success. Do your best to stick to your quit plan, and talk with your health care provider if you have any questions or concerns. This information is not intended to replace advice given to you by your health care provider. Make sure you discuss any questions you have with your health care provider. Document Released: 03/02/2001 Document Revised: 10/12/2016 Document Reviewed: 07/23/2014 Elsevier Interactive Patient Education  2019 [employer].      I discussed the assessment and treatment plan with the patient. The patient was provided an opportunity to ask questions and all were answered. The patient agreed with the plan and demonstrated an understanding of the instructions.   The patient was advised to call back or seek an in-person evaluation if the symptoms worsen or if the condition fails to improve as anticipated.  I provided 19 minutes of non-face-to-face time during this encounter.  Signed,   Merri Ray, MD [facility].  08/07/18

## 2018-08-09 ENCOUNTER — Telehealth: Payer: Self-pay | Admitting: Family Medicine

## 2018-08-09 NOTE — Telephone Encounter (Signed)
08/07/2018 - PATIENT HAD A TELEMED VISIT WITH DR. Carlota Raspberry ON Monday (08/07/2018). DR. Carlota Raspberry HAS REQUESTED HE RETURN IN THE OFFICE FOR A 1 MONTH FOLLOW-UP. HE ALSO WANTS HIM TO HAVE A NURSE APPOINTMENT TO GET HIS BLOOD DRAWN IN 1 WEEK. I TRIED TO CALL AND SCHEDULE BUT HAD TO LEAVE HIM A VOICE MAIL TO RETURN OUR CALL. [place]

## 2018-11-17 ENCOUNTER — Encounter: Payer: Self-pay | Admitting: Family Medicine

## 2018-11-17 ENCOUNTER — Ambulatory Visit: Payer: 59 | Admitting: Family Medicine

## 2018-11-17 ENCOUNTER — Other Ambulatory Visit: Payer: Self-pay

## 2018-11-17 VITALS — BP 152/82 | HR 105 | Temp 98.7°F | Resp 17 | Ht 71.0 in | Wt 201.0 lb

## 2018-11-17 DIAGNOSIS — R9431 Abnormal electrocardiogram [ECG] [EKG]: Secondary | ICD-10-CM

## 2018-11-17 DIAGNOSIS — R7989 Other specified abnormal findings of blood chemistry: Secondary | ICD-10-CM

## 2018-11-17 DIAGNOSIS — I10 Essential (primary) hypertension: Secondary | ICD-10-CM

## 2018-11-17 DIAGNOSIS — R000 Tachycardia, unspecified: Secondary | ICD-10-CM

## 2018-11-17 DIAGNOSIS — R945 Abnormal results of liver function studies: Secondary | ICD-10-CM

## 2018-11-17 DIAGNOSIS — Z23 Encounter for immunization: Secondary | ICD-10-CM | POA: Diagnosis not present

## 2018-11-17 DIAGNOSIS — Z9189 Other specified personal risk factors, not elsewhere classified: Secondary | ICD-10-CM

## 2018-11-17 MED ORDER — LOSARTAN POTASSIUM-HCTZ 100-25 MG PO TABS: 1.0000 | ORAL_TABLET | Freq: Every day | ORAL | 1 refills | Status: DC

## 2018-11-17 MED ORDER — METOPROLOL SUCCINATE ER 25 MG PO TB24: 25.0000 mg | ORAL_TABLET | Freq: Every day | ORAL | 1 refills | Status: DC

## 2018-11-17 MED ORDER — ATORVASTATIN CALCIUM 20 MG PO TABS: 20.0000 mg | ORAL_TABLET | Freq: Every day | ORAL | 3 refills | Status: DC

## 2018-11-17 MED ORDER — TAMSULOSIN HCL 0.4 MG PO CAPS: 0.4000 mg | ORAL_CAPSULE | Freq: Every day | ORAL | 3 refills | Status: AC

## 2018-11-17 MED ORDER — AMLODIPINE BESYLATE 10 MG PO TABS: 10.0000 mg | ORAL_TABLET | Freq: Every day | ORAL | 1 refills | Status: DC

## 2018-11-17 NOTE — Patient Instructions (Addendum)
Try zaditor over the counter for watery eyes, but we can discuss this further at follow up.   Continue to try to cut back on alcohol as that can also affect blood pressure.  If you have difficulty cutting back, please let me know as I can provide some resources.  For now can start new blood pressure medicine metoprolol once per day, continue doses of other medicines, and recheck in 3 to 4 weeks.   There were some abnormalities on the EKG, so I would like you to meet with a cardiologist to discuss this further.  I have placed that referral.  If any chest pains, shortness of breath, or new symptoms, be seen right away.  Other medications were refilled the same until we see some of your lab work.  I did refill the tamsulosin but can discuss that with your urologist at next visit.  please let me know if there are questions in the meantime.    Return to the clinic or go to the nearest emergency room if any of your symptoms worsen or new symptoms occur.    If you have lab work done today you will be contacted with your lab results within the next 2 weeks.  If you have not heard from [place] then please contact us. The fastest way to get your results is to register for My Chart.   IF you received an x-ray today, you will receive an invoice from [facility]. Please contact [facility] at [phone] with questions or concerns regarding your invoice.   IF you received labwork today, you will receive an invoice from [facility]. Please contact [facility] at [phone] with questions or concerns regarding your invoice.   Our billing staff will not be able to assist you with questions regarding bills from these companies.  You will be contacted with the lab results as soon as they are available. The fastest way to get your results is to activate your My Chart account. Instructions are located on the last page of this paperwork. If you have not heard from [place] regarding the results in 2 weeks,  please contact this office.

## 2018-11-17 NOTE — Progress Notes (Signed)
Subjective:    Patient ID: Jeffrey Harrington, male    DOB: 1960/09/05, 58 y.o.   MRN: [id number]  HPI Jeffrey Harrington is a 58 y.o. male Presents today for: Chief Complaint  Patient presents with  . concerns about blood pressure  . Medication Refill    amlodipine, atorvastatin, losartan potassium-hctz and tamsulosin   Hypertension: BP Readings from Last 3 Encounters:  11/17/18 (!) 152/82  10/15/17 (!) 155/87  09/13/17 (!) 160/80   Lab Results  Component Value Date   CREATININE 0.92 09/13/2017  Currently on amlodipine 10 mg daily, losartan HCT 100/25 mg daily Discussed at May 18 telemedicine visit.  Was only taking HCTZ 12.5 mg and had been out of amlodipine 2 weeks prior.  Blood pressure has been 162/108 at his recent urology visit.  Additionally noted to have significant alcohol intake/alcohol abuse with approximately 6 drinks per day. Resources were provided for cutting back on alcohol Adjusted losartan HCTZ dosing, and restarted amlodipine.   Has been taking meds above daily. No missed doses.  Thinks home meter malfunctioning - home readings in 150's/90's.   Elevated LFTs: Significant alcohol use as above/alcohol abuse, advised to decrease use to no more than 2-3 drinks initially when discussed at telemedicine visit May 18.  Plan for him to return for lab only visit after the May 18 telemedicine visit but he has not had that done. Hepatitis testing was reassuring.  Ultrasound of liver was ordered last year but never done. Has cut back on alcohol - now limiting to 3 drinks per day.   Lab Results  Component Value Date   ALT 42 10/15/2017   AST 65 (H) 10/15/2017   ALKPHOS 67 10/15/2017   BILITOT 0.4 10/15/2017    Hyperlipidemia:  Lab Results  Component Value Date   CHOL 224 (H) 09/13/2017   HDL 83 09/13/2017   LDLCALC 118 (H) 09/13/2017   TRIG 115 09/13/2017   CHOLHDL 2.7 09/13/2017   Lab Results  Component Value Date   ALT 42 10/15/2017   AST 65 (H) 10/15/2017   ALKPHOS 67 10/15/2017   BILITOT 0.4 10/15/2017  Lipitor 20 mg daily. No new myalgias/side effects - taking daily.   History of prostate cancer: Followed by [facility] - Dr. Consuella Lose S/p radiation only. Ongoing monitoring of PSA with urology - reports last testing 2 weeks ago, then again in November.  Last PSa in the 5 range after radiation.   Takes tamsulosin once per day. Ale to urinate ok on this med. no dizziness/dry mouth/lightheadedness.   Declined HIV screening.   Eyes water every summer, no treatments at present.    Patient Active Problem List   Diagnosis Date Noted  . Malignant neoplasm of prostate ([place]) 12/21/2016  . HTN (hypertension) 10/02/2013  . Personal history of colonic adenomas 06/07/2012   Past Medical History:  Diagnosis Date  . Allergy   . Hypertension   . Personal history of colonic adenomas 06/07/2012   05/2012 - 2 adenomas max 10 mm  . Prostate cancer ([place]) 11/09/2016  . Syncope 2008   Past Surgical History:  Procedure Laterality Date  . COLONOSCOPY  2014, 2017   polyps  . HEMORRHOID BANDING  2014  . HEMORRHOID SURGERY    . INGUINAL HERNIA REPAIR Right   . PROSTATE BIOPSY     No Known Allergies Prior to Admission medications   Medication Sig Start Date End Date Taking? Authorizing Provider  amLODipine (NORVASC) 10 MG tablet Take 1 tablet (10  mg total) by mouth daily. 08/07/18  Yes Wendie Agreste, MD  atorvastatin (LIPITOR) 20 MG tablet Take 1 tablet (20 mg total) by mouth daily. 08/07/18  Yes Wendie Agreste, MD  Blood Pressure Monitoring (BLOOD PRESSURE KIT) DEVI 1 Units by Does not apply route daily. 01/22/17  Yes Timmothy Euler, [place] D, PA-C  losartan-hydrochlorothiazide (HYZAAR) 100-25 MG tablet Take 1 tablet by mouth daily. 08/07/18  Yes Wendie Agreste, MD  tamsulosin [facility]) 0.4 MG CAPS capsule  08/19/17  Yes [provider]   Social History   Socioeconomic History  . Marital status: Married    Spouse name: Not on file  .  Number of children: 2  . Years of education: Not on file  . Highest education level: Not on file  Occupational History  . Occupation: Event organiser: [employer]  . Financial resource strain: Not on file  . Food insecurity    Worry: Not on file    Inability: Not on file  . Transportation needs    Medical: Not on file    Non-medical: Not on file  Tobacco Use  . Smoking status: Current Every Day Smoker    Packs/day: 0.50    Years: 39.00    Pack years: 19.50    Types: Cigarettes  . Smokeless tobacco: Never Used  Substance and Sexual Activity  . Alcohol use: Yes    Alcohol/week: 3.0 standard drinks    Types: 3 Standard drinks or equivalent per week    Comment: 2 per day  . Drug use: No  . Sexual activity: Yes    Partners: Female    Comment: with monogamous partner   Lifestyle  . Physical activity    Days per week: Not on file    Minutes per session: Not on file  . Stress: Not on file  Relationships  . Social Herbalist on phone: Not on file    Gets together: Not on file    Attends religious service: Not on file    Active member of club or organization: Not on file    Attends meetings of clubs or organizations: Not on file    Relationship status: Not on file  . Intimate partner violence    Fear of current or ex partner: Not on file    Emotionally abused: Not on file    Physically abused: Not on file    Forced sexual activity: Not on file  Other Topics Concern  . Not on file  Social History Narrative   Warehouse worker - [employer]   Married, 1 son, 1 daughter    Review of Systems  Constitutional: Negative for fatigue and unexpected weight change.  Eyes: Negative for visual disturbance.  Respiratory: Negative for cough, chest tightness and shortness of breath.   Cardiovascular: Negative for chest pain, palpitations and leg swelling.  Gastrointestinal: Negative for abdominal pain and blood in stool.  Neurological:  Negative for dizziness, light-headedness and headaches.    No new hot or cold intolerance. No new hair or skin changes, heart palpitations or new fatigue. No new weight changes.       Objective:   Physical Exam Vitals signs reviewed.  Constitutional:      Appearance: He is well-developed.  HENT:     Head: Normocephalic and atraumatic.  Eyes:     Pupils: Pupils are equal, round, and reactive to light.     Comments: Eyes watering left greater than  right, slight scleral injection.  No discolored discharge.  Neck:     Vascular: No carotid bruit or JVD.  Cardiovascular:     Rate and Rhythm: Regular rhythm. Tachycardia present.     Heart sounds: Normal heart sounds. No murmur.     Comments: Tachy, but regular, no apparent murmur.  Pulmonary:     Effort: Pulmonary effort is normal.     Breath sounds: Normal breath sounds. No rales.  Skin:    General: Skin is warm and dry.  Neurological:     Mental Status: He is alert and oriented to person, place, and time.    Vitals:   11/17/18 1046 11/17/18 1048 11/17/18 1050  BP: (!) 164/105 (!) 150/91 (!) 152/82  Pulse: (!) 105    Resp: 17    Temp: 98.7 F (37.1 C)    TempSrc: Oral    SpO2: 99%    Weight: 201 lb (91.2 kg)    Height: _0  (1.803 m)     EKG: No prior EKG available for comparison. Sinus tachycardia with rate 106, diffuse ST depression, multiple leads.     Assessment & Plan:   Jeffrey Harrington is a 58 y.o. male Essential hypertension - Plan: CMP14+EGFR, Lipid panel, TSH, losartan-hydrochlorothiazide (HYZAAR) 100-25 MG tablet, amLODipine (NORVASC) 10 MG tablet, metoprolol succinate (TOPROL-XL) 25 MG 24 hr tablet  -Decreased control, continue Hyzaar, amlodipine same doses, add metoprolol 25 mg daily.  Recheck 3 weeks.  Decreased alcohol discussed.  Elevated LFTs - Plan: CMP14+EGFR  -Plan on decreasing alcohol, continue to work on cessation.  Repeat LFTs.  Need for prophylactic vaccination and inoculation against  influenza - Plan: Flu Vaccine QUAD 36+ mos IM  Tachycardia - Plan: EKG 12-Lead, TSH, metoprolol succinate (TOPROL-XL) 25 MG 24 hr tablet 10 year risk of MI or stroke 7.5% or greater - Plan: atorvastatin (LIPITOR) 20 MG tablet Nonspecific abnormal electrocardiogram (ECG) (EKG) - Plan: Ambulatory referral to Cardiology  -Tachycardia with diffuse ST depression noted on EKG, asymptomatic.  -Metoprolol started as above, check TSH, refer to cardiology for evaluation.  Continue Lipitor and check lipids.   -ER precautions given if acute/worsening symptoms.  Over-the-counter Zaditor discussed for watery eyes but plans to discuss this at next visit.  Likely allergic.  Meds ordered this encounter  Medications  . tamsulosin (FLOMAX) 0.4 MG CAPS capsule    Sig: Take 1 capsule (0.4 mg total) by mouth daily.    Dispense:  30 capsule    Refill:  3  . losartan-hydrochlorothiazide (HYZAAR) 100-25 MG tablet    Sig: Take 1 tablet by mouth daily.    Dispense:  90 tablet    Refill:  1  . amLODipine (NORVASC) 10 MG tablet    Sig: Take 1 tablet (10 mg total) by mouth daily.    Dispense:  90 tablet    Refill:  1  . atorvastatin (LIPITOR) 20 MG tablet    Sig: Take 1 tablet (20 mg total) by mouth daily.    Dispense:  90 tablet    Refill:  3  . metoprolol succinate (TOPROL-XL) 25 MG 24 hr tablet    Sig: Take 1 tablet (25 mg total) by mouth daily.    Dispense:  90 tablet    Refill:  1   Patient Instructions   Try zaditor over the counter for watery eyes, but we can discuss this further at follow up.   Continue to try to cut back on alcohol as that can also affect blood  pressure.  If you have difficulty cutting back, please let me know as I can provide some resources.  For now can start new blood pressure medicine metoprolol once per day, continue doses of other medicines, and recheck in 3 to 4 weeks.   There were some abnormalities on the EKG, so I would like you to meet with a cardiologist to discuss  this further.  I have placed that referral.  If any chest pains, shortness of breath, or new symptoms, be seen right away.  Other medications were refilled the same until we see some of your lab work.  I did refill the tamsulosin but can discuss that with your urologist at next visit.  please let me know if there are questions in the meantime.    Return to the clinic or go to the nearest emergency room if any of your symptoms worsen or new symptoms occur.    If you have lab work done today you will be contacted with your lab results within the next 2 weeks.  If you have not heard from [place] then please contact us. The fastest way to get your results is to register for My Chart.   IF you received an x-ray today, you will receive an invoice from [facility]. Please contact [facility] at [phone] with questions or concerns regarding your invoice.   IF you received labwork today, you will receive an invoice from [facility]. Please contact [facility] at [phone] with questions or concerns regarding your invoice.   Our billing staff will not be able to assist you with questions regarding bills from these companies.  You will be contacted with the lab results as soon as they are available. The fastest way to get your results is to activate your My Chart account. Instructions are located on the last page of this paperwork. If you have not heard from [place] regarding the results in 2 weeks, please contact this office.      Signed,   Merri Ray, MD [facility] [place].  11/17/18 11:44 AM

## 2018-11-18 LAB — CMP14+EGFR
ALT: 54 IU/L — ABNORMAL HIGH (ref 0–44)
AST: 91 IU/L — ABNORMAL HIGH (ref 0–40)
Albumin/Globulin Ratio: 1.7 (ref 1.2–2.2)
Albumin: 5.3 g/dL — ABNORMAL HIGH (ref 3.8–4.9)
Alkaline Phosphatase: 65 IU/L (ref 39–117)
BUN/Creatinine Ratio: 10 (ref 9–20)
BUN: 11 mg/dL (ref 6–24)
Bilirubin Total: 0.5 mg/dL (ref 0.0–1.2)
CO2: 24 mmol/L (ref 20–29)
Calcium: 10.8 mg/dL — ABNORMAL HIGH (ref 8.7–10.2)
Chloride: 94 mmol/L — ABNORMAL LOW (ref 96–106)
Creatinine, Ser: 1.06 mg/dL (ref 0.76–1.27)
GFR calc Af Amer: 88 mL/min/{1.73_m2} (ref >59)
GFR calc non Af Amer: 76 mL/min/{1.73_m2} (ref >59)
Globulin, Total: 3.1 g/dL (ref 1.5–4.5)
Glucose: 115 mg/dL — ABNORMAL HIGH (ref 65–99)
Potassium: 3.9 mmol/L (ref 3.5–5.2)
Sodium: 137 mmol/L (ref 134–144)
Total Protein: 8.4 g/dL (ref 6.0–8.5)

## 2018-11-18 LAB — TSH: TSH: 0.642 u[IU]/mL (ref 0.450–4.500)

## 2018-11-18 LAB — LIPID PANEL
Chol/HDL Ratio: 2 ratio (ref 0.0–5.0)
Cholesterol, Total: 186 mg/dL (ref 100–199)
HDL: 95 mg/dL (ref >39)
LDL Calculated: 78 mg/dL (ref 0–99)
Triglycerides: 63 mg/dL (ref 0–149)
VLDL Cholesterol Cal: 13 mg/dL (ref 5–40)

## 2018-11-20 ENCOUNTER — Telehealth: Payer: Self-pay | Admitting: Family Medicine

## 2018-11-20 NOTE — Telephone Encounter (Signed)
New bp medication is making pt dizzy and passing out. Pt would like cb to discuss

## 2018-11-21 ENCOUNTER — Telehealth: Payer: Self-pay | Admitting: Cardiology

## 2018-11-21 NOTE — Telephone Encounter (Signed)
Called and LVM for pt to call office back about concerns.

## 2018-11-21 NOTE — Telephone Encounter (Signed)
Patient states he cannot schedule at this time as he has too much going on.

## 2018-11-23 NOTE — Telephone Encounter (Signed)
Started on metoprolol last visit.  Okay to hold on that medication, monitor home readings.  If any return of dizziness or dyspnea needs to be seen right away. Thanks.

## 2018-11-23 NOTE — Telephone Encounter (Signed)
FYI - C/b to pt.  Pt states he took his new BP medicine Monday morning and reports dizziness, SOB afterward.  Has not taken it again and has not had any further dizziness/SOB.  Was not able to take BP during that time.  Pt believes it was the Amlodipine that was new but is not 100% sure as the bottle is at home.  Advised pt to not take any further doses of the new medication and keep a daily BP log until f/u in two weeks.  If he has concerns with s/s or BP prior to appt, please c/b.  Pt verbalized understanding and has no further questions at this time. Please advise if you would like any further instructions provided to the patient.   Thank you, Wilfred Curtis

## 2018-11-29 ENCOUNTER — Encounter: Payer: Self-pay | Admitting: Radiology

## 2018-12-07 ENCOUNTER — Ambulatory Visit: Payer: 59 | Admitting: Family Medicine

## 2019-01-30 ENCOUNTER — Other Ambulatory Visit: Payer: Self-pay | Admitting: Physician Assistant

## 2019-06-26 ENCOUNTER — Telehealth: Payer: 59 | Admitting: Family Medicine

## 2019-06-26 ENCOUNTER — Other Ambulatory Visit: Payer: Self-pay

## 2019-06-27 ENCOUNTER — Encounter: Payer: Self-pay | Admitting: Family Medicine

## 2019-07-04 ENCOUNTER — Ambulatory Visit (INDEPENDENT_AMBULATORY_CARE_PROVIDER_SITE_OTHER): Payer: 59 | Admitting: Family Medicine

## 2019-07-04 ENCOUNTER — Other Ambulatory Visit: Payer: Self-pay

## 2019-07-04 ENCOUNTER — Encounter: Payer: Self-pay | Admitting: Family Medicine

## 2019-07-04 VITALS — BP 144/82 | HR 98 | Temp 98.0°F | Ht 71.0 in | Wt 195.0 lb

## 2019-07-04 DIAGNOSIS — I10 Essential (primary) hypertension: Secondary | ICD-10-CM

## 2019-07-04 DIAGNOSIS — R739 Hyperglycemia, unspecified: Secondary | ICD-10-CM

## 2019-07-04 DIAGNOSIS — F1010 Alcohol abuse, uncomplicated: Secondary | ICD-10-CM | POA: Diagnosis not present

## 2019-07-04 DIAGNOSIS — R5383 Other fatigue: Secondary | ICD-10-CM | POA: Diagnosis not present

## 2019-07-04 DIAGNOSIS — R7989 Other specified abnormal findings of blood chemistry: Secondary | ICD-10-CM

## 2019-07-04 DIAGNOSIS — R55 Syncope and collapse: Secondary | ICD-10-CM | POA: Diagnosis not present

## 2019-07-04 DIAGNOSIS — Z23 Encounter for immunization: Secondary | ICD-10-CM

## 2019-07-04 DIAGNOSIS — R9431 Abnormal electrocardiogram [ECG] [EKG]: Secondary | ICD-10-CM

## 2019-07-04 NOTE — Progress Notes (Addendum)
Subjective:  Patient ID: Jeffrey Harrington, male    DOB: [birth year]  Age: 59 y.o. MRN: [id number]  CC:  Chief Complaint  Patient presents with  . Hypertension    pt states no issues with this condition.pt checks his BP 3x a week. ranges from 135-150/ 111-109. pt reports no physical symptoms of hypertension.   . work clearance    on 06/25/2019. pt had an isident at work that they thought was a seizure. according to the pt when paramedics arived they deturmend he was dyhydrated. the paperwork states to review paramedics note, but the pt didn't bring them with him. on the day this is happening pt reports he was in a morning meeting his stomach was up-set and hed layed down and closed his eyes once he opened them job had called EMS. pt vomited liquid shortly after.    HPI Jeffrey Harrington presents for   Hypertension: Amlodipine 10 mg daily, losartan/HCTZ 100/25 mg daily, Toprol XL 25 mg daily. Felt shaky feeling initially on metoprolol. Stopped temporarily, but back on past month and doing ok now.  Rare missed dose of amlodipine.  Home readings: 140's/100 range.  Tachycardia with abnormal EKG in 10/2018. Referred to cardiology but he did not go. States was due to transportation. Denies chest pains.   BP Readings from Last 3 Encounters:  07/04/19 (!) 144/82  11/17/18 (!) 152/82  10/15/17 (!) 155/87   Lab Results  Component Value Date   CREATININE 1.06 11/17/2018   Hyperlipidemia: Lipitor 20 mg daily. No new myalgias or side effects.  Lab Results  Component Value Date   CHOL 186 11/17/2018   HDL 95 11/17/2018   LDLCALC 78 11/17/2018   TRIG 63 11/17/2018   CHOLHDL 2.0 11/17/2018   Lab Results  Component Value Date   ALT 54 (H) 11/17/2018   AST 91 (H) 11/17/2018   ALKPHOS 65 11/17/2018   BILITOT 0.5 11/17/2018   Episode at work April 5th.  Went to cookout day prior.  Increased alcohol day prior - 10 beers during cookout.  Usually 5 drinks, 3 airplane bottles, and 2 beers day  during week, 6-7 airplane bottles on Saturday, 4-5 beers on Saturday, 4 beers on Sunday. Had meeting at work. Ready to start work. Stomach cramping. [place] working. Layed down on boxes with eyes closed.  Coworker checked on him. Unable to wake him up. Called EMS. He woke up, vomited liquid once, then felt better. No chest pain. Coworkers noticed shaking - they were concerned about a seizure. Paramedics told him he was dehydrated. No transport/ER treatment. Oral rehydration recommended.  No incontinence. No tongue/mouth injury. Felt like he was back to normal after he woke up. Not postictal.  No hx of seizure. Denies hx of DT's.  Rehab for alcohol in 20's.  No headaches.  Denies return of symptoms since 06/25/19.  Denies addiction to alcohol, but father was alcoholic.  DUI: 20 years ago. No recent issue.  Works as Financial controller. Drives forklift.   Work paper indicates "regarding seizure that occurred at work 06/25/19". No EMS paperwork available.     Office Visit from 07/04/2019 in [facility] at [facility]  AUDIT-C Score  9      Elevated LFT's Alcohol abuse as above.  6 week follow up recommend in 10/2018. Has not returned since that time.    Lab Results  Component Value Date   ALT 54 (H) 11/17/2018   AST 91 (H) 11/17/2018   ALKPHOS 65 11/17/2018  BILITOT 0.5 11/17/2018    Hyperglycemia: 115 in 10/2018 .   tdap due - will have today.   History Patient Active Problem List   Diagnosis Date Noted  . Malignant neoplasm of prostate ([place]) 12/21/2016  . HTN (hypertension) 10/02/2013  . Personal history of colonic adenomas 06/07/2012   Past Medical History:  Diagnosis Date  . Allergy   . Hypertension   . Personal history of colonic adenomas 06/07/2012   05/2012 - 2 adenomas max 10 mm  . Prostate cancer ([place]) 11/09/2016  . Syncope 2008   Past Surgical History:  Procedure Laterality Date  . COLONOSCOPY  2014, 2017   polyps  . HEMORRHOID BANDING  2014  . HEMORRHOID SURGERY      . INGUINAL HERNIA REPAIR Right   . PROSTATE BIOPSY     No Known Allergies Prior to Admission medications   Medication Sig Start Date End Date Taking? Authorizing Provider  amLODipine (NORVASC) 10 MG tablet Take 1 tablet (10 mg total) by mouth daily. 11/17/18  Yes Wendie Agreste, MD  atorvastatin (LIPITOR) 20 MG tablet Take 1 tablet (20 mg total) by mouth daily. 11/17/18  Yes Wendie Agreste, MD  Blood Pressure Monitoring (BLOOD PRESSURE KIT) DEVI 1 Units by Does not apply route daily. 01/22/17  Yes Timmothy Euler, [place] D, PA-C  losartan-hydrochlorothiazide (HYZAAR) 100-25 MG tablet Take 1 tablet by mouth daily. 11/17/18  Yes Wendie Agreste, MD  metoprolol succinate (TOPROL-XL) 25 MG 24 hr tablet Take 1 tablet (25 mg total) by mouth daily. 11/17/18  Yes Wendie Agreste, MD  tamsulosin (FLOMAX) 0.4 MG CAPS capsule Take 1 capsule (0.4 mg total) by mouth daily. 11/17/18  Yes Wendie Agreste, MD   Social History   Socioeconomic History  . Marital status: Married    Spouse name: Not on file  . Number of children: 2  . Years of education: Not on file  . Highest education level: Not on file  Occupational History  . Occupation: Event organiser: [employer]  Tobacco Use  . Smoking status: Current Every Day Smoker    Packs/day: 0.50    Years: 39.00    Pack years: 19.50    Types: Cigarettes  . Smokeless tobacco: Never Used  Substance and Sexual Activity  . Alcohol use: Yes    Alcohol/week: 3.0 standard drinks    Types: 3 Standard drinks or equivalent per week    Comment: 2 per day  . Drug use: No  . Sexual activity: Yes    Partners: Female    Comment: with monogamous partner   Other Topics Concern  . Not on file  Social History Narrative   Warehouse worker - [employer]   Married, 1 son, 1 daughter   Social Determinants of Health   Financial Resource Strain:   . Difficulty of Paying Living Expenses:   Food Insecurity:   . Worried About Sales executive in the Last Year:   . Arboriculturist in the Last Year:   Transportation Needs:   . Film/video editor (Medical):   Marland Kitchen Lack of Transportation (Non-Medical):   Physical Activity:   . Days of Exercise per Week:   . Minutes of Exercise per Session:   Stress:   . Feeling of Stress :   Social Connections:   . Frequency of Communication with Friends and Family:   . Frequency of Social Gatherings with Friends and Family:   . Attends Religious  Services:   . Active Member of Clubs or Organizations:   . Attends Archivist Meetings:   Marland Kitchen Marital Status:   Intimate Partner Violence:   . Fear of Current or Ex-Partner:   . Emotionally Abused:   Marland Kitchen Physically Abused:   . Sexually Abused:     Review of Systems  Constitutional: Negative for fatigue and unexpected weight change.  Eyes: Negative for visual disturbance.  Respiratory: Negative for cough, chest tightness and shortness of breath.   Cardiovascular: Negative for chest pain, palpitations and leg swelling.  Gastrointestinal: Negative for abdominal pain and blood in stool.  Neurological: Negative for dizziness, light-headedness and headaches.     Objective:   Vitals:   07/04/19 0923 07/04/19 0925  BP: (!) 157/101 (!) 144/82  Pulse: 98   Temp: 98 F (36.7 C)   TempSrc: Temporal   SpO2: 99%   Weight: 195 lb (88.5 kg)   Height: '5\' 11"'$  (1.803 m)      Physical Exam Vitals reviewed.  Constitutional:      Appearance: He is well-developed.  HENT:     Head: Normocephalic and atraumatic.  Eyes:     Pupils: Pupils are equal, round, and reactive to light.  Neck:     Vascular: No carotid bruit or JVD.  Cardiovascular:     Rate and Rhythm: Normal rate and regular rhythm.     Heart sounds: Normal heart sounds. No murmur.  Pulmonary:     Effort: Pulmonary effort is normal.     Breath sounds: Normal breath sounds. No rales.  Skin:    General: Skin is warm and dry.  Neurological:     Mental Status: He is alert  and oriented to person, place, and time.  Psychiatric:        Mood and Affect: Mood normal.        Behavior: Behavior normal.     EKG: Sinus rhythm, nonspecific ST depression, no apparent significant change from August 2020 EKG.    Assessment & Plan:  Jeffrey Harrington is a 59 y.o. male . Essential hypertension - Plan: Comprehensive metabolic panel, TSH, Ambulatory referral to Cardiology Fatigue, unspecified type - Plan: TSH Syncope, unspecified syncope type - Plan: TSH, EKG 12-Lead, Ambulatory referral to Cardiology Nonspecific abnormal electrocardiogram (ECG) (EKG) - Plan: Ambulatory referral to Cardiology Alcohol abuse - Plan: Comprehensive metabolic panel  -Episode of fatigue, possible syncope at work followed by vomiting.  Work paperwork indicates possible seizure, but history does not fit with typical seizure activity.  No history of seizure disorder.   -  -Suspect alcohol use day prior, possible volume depletion may have contributed.  Concerns regarding alcohol abuse/addiction were discussed as well as resources provided, agrees to cut back. Alcohol use likely contributes to decreased blood pressure control as well.  We will continue same regimen for now if able to cut back.   -Refer to cardiology as previously recommended for abnormal EKG.  ER/RTC precautions given.    -Will review EMS paperwork to complete release for his work, but temporary letter was provided.  Elevated LFTs - Plan: Comprehensive metabolic panel  -Repeat LFTs, decrease alcohol use recommended  Hyperglycemia - Plan: Hemoglobin A1c  Need for prophylactic vaccination with combined diphtheria-tetanus-pertussis (DTP) vaccine - Plan: Tdap vaccine greater than or equal to 7yo IM   No orders of the defined types were placed in this encounter.  Patient Instructions   Blood pressure is too high - likely from your alcohol use. I will continue  same dose of meds if able to cut back on drinking. recheck in 2 weeks.  It is  important to follow-up with cardiology as your EKG or heart electrical test appears abnormal.  I will place another referral. Make sure to stay well-hydrated. I will need more info about 4/5 episode at works and will need to see EMS notes.  I will look at the paperwork to see if I can complete some of that at this time but I did provide a note temporarily for your employer.  If any further symptoms of feeling lightheaded, dizzy or passing out, should be seen in the emergency room or call 911 right away. Return to the clinic or go to the nearest emergency room if any of your symptoms worsen or new symptoms occur.  I am concerned about your drinking. I do recommend cutting back on intake, but here are resources to help with alcohol as well.   [facility]  Address: [address], [place], [place] [address]  Phone: [phone]  [facility]  Address: [address], [place], [place] [address]  Phone: [phone]  [facility] Address: [address], [place], [place] [address]  Phone: [phone]     If you have lab work done today you will be contacted with your lab results within the next 2 weeks.  If you have not heard from [place] then please contact us. The fastest way to get your results is to register for My Chart.   IF you received an x-ray today, you will receive an invoice from [place] at [facility]. Please contact [facility] at [phone] with questions or concerns regarding your invoice.   IF you received labwork today, you will receive an invoice from [place]. Please contact [facility] at [phone] with questions or concerns regarding your invoice.   Our billing staff will not be able to assist you with questions regarding bills from these companies.  You will be contacted with the lab results as soon as they are available. The fastest way to get your results is to activate your My Chart account. Instructions are located on the last page of  this paperwork. If you have not heard from [place] regarding the results in 2 weeks, please contact this office.         Signed, Merri Ray, MD [facility]    6:42 PM 07/05/19 Addendum.  EMS report reviewed from June 25, 2019.  Vital signs of blood pressure 128/76, 130/78.  Pulse of 88, 84, 90, 90.  Respiratory rate 16.  O2 sat 97%.  Blood glucose 141, temp 97.  GCS 15.  Patient was alert and oriented x4, lying laterally on the warehouse floor.  Had episode of vomiting after initially feeling nauseous during a meeting.  2 episodes of emesis then 911 contacted.  Coworker smelled alcohol on the patient and EMS also noted the same smell in the vomitus on the floor near the patient.  Refused further treatment/transport at that time but no indication from EMS notes of post ictal state or seizure activity.  Suspect vomiting from previous alcohol use.  Need for alcohol assistance as above will be stressed to patient again as part of plan.  At this time I do recommend he avoid driving forklift, operating heavy machinery or working from elevated surfaces as although not appearing to be seizures, potentially alcohol use disorder may have contributed to his symptoms on the fifth.  Will discuss alcohol  use and plan moving forward at his follow-up visit with me on April 28.  Restrictions will be provided on his paperwork for employer.

## 2019-07-04 NOTE — Patient Instructions (Addendum)
Blood pressure is too high - likely from your alcohol use. I will continue same dose of meds if able to cut back on drinking. recheck in 2 weeks.  It is important to follow-up with cardiology as your EKG or heart electrical test appears abnormal.  I will place another referral. Make sure to stay well-hydrated. I will need more info about 4/5 episode at works and will need to see EMS notes.  I will look at the paperwork to see if I can complete some of that at this time but I did provide a note temporarily for your employer.  If any further symptoms of feeling lightheaded, dizzy or passing out, should be seen in the emergency room or call 911 right away. Return to the clinic or go to the nearest emergency room if any of your symptoms worsen or new symptoms occur.  I am concerned about your drinking. I do recommend cutting back on intake, but here are resources to help with alcohol as well.   [facility]  Address: [address] [place], [place] [address]  Phone: [phone]  [facility]  Address: [address] [place], [place] [address]  Phone: [phone]  [facility] Address: [place], [place], [place] [address]  Phone: [phone]     If you have lab work done today you will be contacted with your lab results within the next 2 weeks.  If you have not heard from [place] then please contact us. The fastest way to get your results is to register for My Chart.   IF you received an x-ray today, you will receive an invoice from [facility]. Please contact [facility] at [phone] with questions or concerns regarding your invoice.   IF you received labwork today, you will receive an invoice from [place]. Please contact [facility] at [phone] with questions or concerns regarding your invoice.   Our billing staff will not be able to assist you with questions regarding bills from these companies.  You will be contacted with the lab  results as soon as they are available. The fastest way to get your results is to activate your My Chart account. Instructions are located on the last page of this paperwork. If you have not heard from [place] regarding the results in 2 weeks, please contact this office.

## 2019-07-05 LAB — COMPREHENSIVE METABOLIC PANEL
ALT: 111 IU/L — ABNORMAL HIGH (ref 0–44)
AST: 102 IU/L — ABNORMAL HIGH (ref 0–40)
Albumin/Globulin Ratio: 1.5 (ref 1.2–2.2)
Albumin: 5.1 g/dL — ABNORMAL HIGH (ref 3.8–4.9)
Alkaline Phosphatase: 84 IU/L (ref 39–117)
BUN/Creatinine Ratio: 9 — ABNORMAL LOW (ref 10–24)
BUN: 9 mg/dL (ref 8–27)
Bilirubin Total: 0.6 mg/dL (ref 0.0–1.2)
CO2: 24 mmol/L (ref 20–29)
Calcium: 10.3 mg/dL — ABNORMAL HIGH (ref 8.6–10.2)
Chloride: 92 mmol/L — ABNORMAL LOW (ref 96–106)
Creatinine, Ser: 1.01 mg/dL (ref 0.76–1.27)
GFR calc Af Amer: 93 mL/min/{1.73_m2} (ref >59)
GFR calc non Af Amer: 80 mL/min/{1.73_m2} (ref >59)
Globulin, Total: 3.3 g/dL (ref 1.5–4.5)
Glucose: 134 mg/dL — ABNORMAL HIGH (ref 65–99)
Potassium: 4 mmol/L (ref 3.5–5.2)
Sodium: 137 mmol/L (ref 134–144)
Total Protein: 8.4 g/dL (ref 6.0–8.5)

## 2019-07-05 LAB — TSH: TSH: 1.17 u[IU]/mL (ref 0.450–4.500)

## 2019-07-05 LAB — HEMOGLOBIN A1C
Est. average glucose Bld gHb Est-mCnc: 114 mg/dL
Hgb A1c MFr Bld: 5.6 % (ref 4.8–5.6)

## 2019-07-09 ENCOUNTER — Telehealth: Payer: Self-pay

## 2019-07-09 NOTE — Telephone Encounter (Signed)
Pt was last seen on 07/04/19.  Pt came to the office to pick up medical clearance form, provider had the form and spoke to pt for a few minutes about the form in room 7.  Form given to pt and a copy has been placed in the scan bin for pt.   Pt has an upcoming appointment with provider on 07/18/19.

## 2019-07-18 ENCOUNTER — Other Ambulatory Visit: Payer: Self-pay

## 2019-07-18 ENCOUNTER — Ambulatory Visit (INDEPENDENT_AMBULATORY_CARE_PROVIDER_SITE_OTHER): Payer: 59 | Admitting: Family Medicine

## 2019-07-18 ENCOUNTER — Encounter: Payer: Self-pay | Admitting: Family Medicine

## 2019-07-18 VITALS — BP 136/78 | HR 69 | Temp 98.2°F | Resp 16 | Ht 71.0 in | Wt 197.2 lb

## 2019-07-18 DIAGNOSIS — I10 Essential (primary) hypertension: Secondary | ICD-10-CM

## 2019-07-18 DIAGNOSIS — E785 Hyperlipidemia, unspecified: Secondary | ICD-10-CM | POA: Diagnosis not present

## 2019-07-18 DIAGNOSIS — R000 Tachycardia, unspecified: Secondary | ICD-10-CM

## 2019-07-18 DIAGNOSIS — Z9189 Other specified personal risk factors, not elsewhere classified: Secondary | ICD-10-CM

## 2019-07-18 DIAGNOSIS — R7989 Other specified abnormal findings of blood chemistry: Secondary | ICD-10-CM | POA: Diagnosis not present

## 2019-07-18 DIAGNOSIS — F1010 Alcohol abuse, uncomplicated: Secondary | ICD-10-CM | POA: Diagnosis not present

## 2019-07-18 LAB — COMPREHENSIVE METABOLIC PANEL
ALT: 95 IU/L — ABNORMAL HIGH (ref 0–44)
AST: 130 IU/L — ABNORMAL HIGH (ref 0–40)
Albumin/Globulin Ratio: 1.8 (ref 1.2–2.2)
Albumin: 4.9 g/dL (ref 3.8–4.9)
Alkaline Phosphatase: 68 IU/L (ref 39–117)
BUN/Creatinine Ratio: 10 (ref 10–24)
BUN: 9 mg/dL (ref 8–27)
Bilirubin Total: 0.4 mg/dL (ref 0.0–1.2)
CO2: 23 mmol/L (ref 20–29)
Calcium: 10.1 mg/dL (ref 8.6–10.2)
Chloride: 98 mmol/L (ref 96–106)
Creatinine, Ser: 0.91 mg/dL (ref 0.76–1.27)
GFR calc Af Amer: 106 mL/min/{1.73_m2} (ref >59)
GFR calc non Af Amer: 91 mL/min/{1.73_m2} (ref >59)
Globulin, Total: 2.7 g/dL (ref 1.5–4.5)
Glucose: 75 mg/dL (ref 65–99)
Potassium: 4.3 mmol/L (ref 3.5–5.2)
Sodium: 141 mmol/L (ref 134–144)
Total Protein: 7.6 g/dL (ref 6.0–8.5)

## 2019-07-18 LAB — LIPID PANEL
Chol/HDL Ratio: 1.8 ratio (ref 0.0–5.0)
Cholesterol, Total: 184 mg/dL (ref 100–199)
HDL: 100 mg/dL (ref >39)
LDL Chol Calc (NIH): 71 mg/dL (ref 0–99)
Triglycerides: 71 mg/dL (ref 0–149)
VLDL Cholesterol Cal: 13 mg/dL (ref 5–40)

## 2019-07-18 MED ORDER — METOPROLOL SUCCINATE ER 25 MG PO TB24: 25.0000 mg | ORAL_TABLET | Freq: Every day | ORAL | 1 refills | Status: DC

## 2019-07-18 MED ORDER — ATORVASTATIN CALCIUM 20 MG PO TABS: 20.0000 mg | ORAL_TABLET | Freq: Every day | ORAL | 3 refills | Status: AC

## 2019-07-18 MED ORDER — LOSARTAN POTASSIUM-HCTZ 100-25 MG PO TABS: 1.0000 | ORAL_TABLET | Freq: Every day | ORAL | 1 refills | Status: DC

## 2019-07-18 MED ORDER — AMLODIPINE BESYLATE 10 MG PO TABS: 10.0000 mg | ORAL_TABLET | Freq: Every day | ORAL | 1 refills | Status: DC

## 2019-07-18 NOTE — Progress Notes (Signed)
Subjective:  Patient ID: Jeffrey Harrington, male    DOB: [birth year]  Age: 59 y.o. MRN: [id number]  CC:  Chief Complaint  Patient presents with  . Hypertension    pt denies physical symptoms, 140/90    HPI Jeffrey Harrington presents for   Hypertension: Amlodipine 10 mg daily, Toprol-XL 25 mg daily, losartan HCTZ 100/25 mg daily. Home readings: 130/80-90.  No added salt.  Some restaurant food at times.  BP Readings from Last 3 Encounters:  07/18/19 136/78  07/04/19 (!) 144/82  11/17/18 (!) 152/82   Lab Results  Component Value Date   CREATININE 1.01 07/04/2019   Hyperlipidemia: Lipitor 20 mg daily.  No new side effects/myalgias.  Lab Results  Component Value Date   CHOL 186 11/17/2018   HDL 95 11/17/2018   LDLCALC 78 11/17/2018   TRIG 63 11/17/2018   CHOLHDL 2.0 11/17/2018   Lab Results  Component Value Date   ALT 111 (H) 07/04/2019   AST 102 (H) 07/04/2019   ALKPHOS 84 07/04/2019   BILITOT 0.6 07/04/2019   Alcohol abuse: See prior visit. Episode at work after drinking day prior.no prior work realted issues with alcohol - same place of employment for 17 years.  Audit C score 9.  Decreased use discussed. No further hard alcohol/liquor. Beer now - 2-3 per day. Looking into [facility] to help with stopping alcohol. Checking with insurance coverage.  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  No withdrawal symptoms. Back at work. Doing ok. Would like to return to full duty. Feels a lot better.  No morning drinks or during work.  No fatigue during workday, no n/v/abd pain.  No seizures.  Elevated LFT's noted 4/14. Up from AST 91, ALT 54 in 10/2018.  Lab Results  Component Value Date   ALT 111 (H) 07/04/2019   AST 102 (H) 07/04/2019   ALKPHOS 84 07/04/2019   BILITOT 0.6 07/04/2019      History Patient Active Problem List   Diagnosis Date Noted  . Malignant neoplasm of prostate ([place]) 12/21/2016  . HTN (hypertension) 10/02/2013  . Personal history of colonic  adenomas 06/07/2012   Past Medical History:  Diagnosis Date  . Allergy   . Hypertension   . Personal history of colonic adenomas 06/07/2012   05/2012 - 2 adenomas max 10 mm  . Prostate cancer ([place]) 11/09/2016  . Syncope 2008   Past Surgical History:  Procedure Laterality Date  . COLONOSCOPY  2014, 2017   polyps  . HEMORRHOID BANDING  2014  . HEMORRHOID SURGERY    . INGUINAL HERNIA REPAIR Right   . PROSTATE BIOPSY     No Known Allergies Prior to Admission medications   Medication Sig Start Date End Date Taking? Authorizing Provider  amLODipine (NORVASC) 10 MG tablet Take 1 tablet (10 mg total) by mouth daily. 11/17/18  Yes Wendie Agreste, MD  atorvastatin (LIPITOR) 20 MG tablet Take 1 tablet (20 mg total) by mouth daily. 11/17/18  Yes Wendie Agreste, MD  Blood Pressure Monitoring (BLOOD PRESSURE KIT) DEVI 1 Units by Does not apply route daily. 01/22/17  Yes Timmothy Euler, [place] D, PA-C  losartan-hydrochlorothiazide (HYZAAR) 100-25 MG tablet Take 1 tablet by mouth daily. 11/17/18  Yes Wendie Agreste, MD  metoprolol succinate (TOPROL-XL) 25 MG 24 hr tablet Take 1 tablet (25 mg total) by mouth daily. 11/17/18  Yes Wendie Agreste, MD  tamsulosin (FLOMAX) 0.4 MG CAPS capsule Take 1 capsule (0.4 mg total)  by mouth daily. 11/17/18  Yes Wendie Agreste, MD   Social History   Socioeconomic History  . Marital status: Married    Spouse name: Not on file  . Number of children: 2  . Years of education: Not on file  . Highest education level: Not on file  Occupational History  . Occupation: Event organiser: [employer]  Tobacco Use  . Smoking status: Current Every Day Smoker    Packs/day: 0.50    Years: 39.00    Pack years: 19.50    Types: Cigarettes  . Smokeless tobacco: Never Used  Substance and Sexual Activity  . Alcohol use: Yes    Alcohol/week: 3.0 standard drinks    Types: 3 Standard drinks or equivalent per week    Comment: 2 per day  . Drug use: No    . Sexual activity: Yes    Partners: Female    Comment: with monogamous partner   Other Topics Concern  . Not on file  Social History Narrative   Warehouse worker - [employer]   Married, 1 son, 1 daughter   Social Determinants of Health   Financial Resource Strain:   . Difficulty of Paying Living Expenses:   Food Insecurity:   . Worried About Charity fundraiser in the Last Year:   . Arboriculturist in the Last Year:   Transportation Needs:   . Film/video editor (Medical):   Marland Kitchen Lack of Transportation (Non-Medical):   Physical Activity:   . Days of Exercise per Week:   . Minutes of Exercise per Session:   Stress:   . Feeling of Stress :   Social Connections:   . Frequency of Communication with Friends and Family:   . Frequency of Social Gatherings with Friends and Family:   . Attends Religious Services:   . Active Member of Clubs or Organizations:   . Attends Archivist Meetings:   Marland Kitchen Marital Status:   Intimate Partner Violence:   . Fear of Current or Ex-Partner:   . Emotionally Abused:   Marland Kitchen Physically Abused:   . Sexually Abused:     Review of Systems  Constitutional: Negative for fatigue and unexpected weight change.  Eyes: Negative for visual disturbance.  Respiratory: Negative for cough, chest tightness and shortness of breath.   Cardiovascular: Negative for chest pain, palpitations and leg swelling.  Gastrointestinal: Negative for abdominal pain and blood in stool.  Neurological: Negative for dizziness, light-headedness and headaches.     Objective:   Vitals:   07/18/19 0852 07/18/19 0857  BP: (!) 146/87 136/78  Pulse: 69   Resp: 16   Temp: 98.2 F (36.8 C)   TempSrc: Temporal   SpO2: 99%   Weight: 197 lb 3.2 oz (89.4 kg)   Height: _0  (1.803 m)      Physical Exam Vitals reviewed.  Constitutional:      Appearance: He is well-developed.  HENT:     Head: Normocephalic and atraumatic.  Eyes:     Pupils: Pupils are equal,  round, and reactive to light.  Neck:     Vascular: No carotid bruit or JVD.  Cardiovascular:     Rate and Rhythm: Normal rate and regular rhythm.     Heart sounds: Normal heart sounds. No murmur.  Pulmonary:     Effort: Pulmonary effort is normal.     Breath sounds: Normal breath sounds. No rales.  Skin:    General: Skin is  warm and dry.  Neurological:     General: No focal deficit present.     Mental Status: He is alert and oriented to person, place, and time.     Motor: No weakness.  Psychiatric:        Mood and Affect: Mood normal.        Behavior: Behavior normal.        Assessment & Plan:  Jeffrey Harrington is a 59 y.o. male . Essential hypertension - Plan: Comprehensive metabolic panel, losartan-hydrochlorothiazide (HYZAAR) 100-25 MG tablet, amLODipine (NORVASC) 10 MG tablet, metoprolol succinate (TOPROL-XL) 25 MG 24 hr tablet  -  Stable, improving control with less alcohol,  tolerating current regimen. Medications refilled. Labs pending as above.   Alcohol abuse  - commended on his decreased use and efforts to meet with cessation specialist. Benefits with BP and LFTs' discussed.   - note given to rtw without restrictions. He agrees to stop work and not Astronomer if any n/v/fatigue or new symptoms.   Elevated LFTs - Plan: Comprehensive metabolic panel  - repeat labs. anticiapte improvement with decreased alcohol use.   Hyperlipidemia, unspecified hyperlipidemia type - Plan: Comprehensive metabolic panel, Lipid panel 10 year risk of MI or stroke 7.5% or greater - Plan: atorvastatin (LIPITOR) 20 MG tablet  -  Stable, tolerating current regimen. Medications refilled. Labs pending as above.   Tachycardia - Plan: metoprolol succinate (TOPROL-XL) 25 MG 24 hr tablet  - stable on vitals today - refilled     Meds ordered this encounter  Medications  . losartan-hydrochlorothiazide (HYZAAR) 100-25 MG tablet    Sig: Take 1 tablet by mouth daily.    Dispense:  90 tablet     Refill:  1  . amLODipine (NORVASC) 10 MG tablet    Sig: Take 1 tablet (10 mg total) by mouth daily.    Dispense:  90 tablet    Refill:  1  . metoprolol succinate (TOPROL-XL) 25 MG 24 hr tablet    Sig: Take 1 tablet (25 mg total) by mouth daily.    Dispense:  90 tablet    Refill:  1  . atorvastatin (LIPITOR) 20 MG tablet    Sig: Take 1 tablet (20 mg total) by mouth daily.    Dispense:  90 tablet    Refill:  3   Patient Instructions     If you have lab work done today you will be contacted with your lab results within the next 2 weeks.  If you have not heard from [place] then please contact us. The fastest way to get your results is to register for My Chart.   IF you received an x-ray today, you will receive an invoice from [facility]. Please contact [facility] at [phone] with questions or concerns regarding your invoice.   IF you received labwork today, you will receive an invoice from [facility]. Please contact [facility] at [phone] with questions or concerns regarding your invoice.   Our billing staff will not be able to assist you with questions regarding bills from these companies.  You will be contacted with the lab results as soon as they are available. The fastest way to get your results is to activate your My Chart account. Instructions are located on the last page of this paperwork. If you have not heard from [place] regarding the results in 2 weeks, please contact this office.         Signed, Merri Ray, MD [facility]

## 2019-07-18 NOTE — Patient Instructions (Addendum)
° ° ° °  If you have lab work done today you will be contacted with your lab results within the next 2 weeks.  If you have not heard from us then please contact us. The fastest way to get your results is to register for My Chart. ° ° °IF you received an x-ray today, you will receive an invoice from [facility]. Please contact [facility] at [phone] with questions or concerns regarding your invoice.  ° °IF you received labwork today, you will receive an invoice from [facility]. Please contact [facility] at [phone] with questions or concerns regarding your invoice.  ° °Our billing staff will not be able to assist you with questions regarding bills from these companies. ° °You will be contacted with the lab results as soon as they are available. The fastest way to get your results is to activate your My Chart account. Instructions are located on the last page of this paperwork. If you have not heard from us regarding the results in 2 weeks, please contact this office. °  ° ° ° °

## 2019-07-19 ENCOUNTER — Telehealth: Payer: Self-pay | Admitting: Family Medicine

## 2019-07-19 NOTE — Telephone Encounter (Signed)
I called pt to find out where he wants [place] to get his medical info from. I did leave a message.

## 2019-07-20 ENCOUNTER — Ambulatory Visit: Payer: Self-pay | Admitting: Cardiology

## 2019-07-24 ENCOUNTER — Other Ambulatory Visit: Payer: Self-pay | Admitting: Family Medicine

## 2019-07-24 DIAGNOSIS — R7989 Other specified abnormal findings of blood chemistry: Secondary | ICD-10-CM

## 2019-07-24 NOTE — Progress Notes (Signed)
See labs 

## 2019-07-24 NOTE — Progress Notes (Signed)
Pt is aware of lab results and the ultrasound he is awaiting appointment for

## 2019-08-16 NOTE — Progress Notes (Signed)
Date:  08/17/2019   ID:  Jeffrey Harrington, DOB October 22, 1960, MRN [id number]  PCP:  Wendie Agreste, MD  Cardiologist:  Rex Kras, DO, [facility] (established care 08/17/2019)  REASON FOR CONSULT:  I10 (ICD-10-CM) - Essential hypertension R55 (ICD-10-CM) - Syncope, unspecified syncope type R94.31 (ICD-10-CM) - Nonspecific abnormal electrocardiogram (ECG) (EKG)  REQUESTING PHYSICIAN:  Wendie Agreste, MD [address] [place],  [place] [address]  Chief Complaint  Patient presents with  . Hypertension  . Abnormal ECG  . New Patient (Initial Visit)    possible syncope.    HPI  Jeffrey Harrington is a 59 y.o. male who is being seen today for the evaluation of possible syncopal event, hypertension and abnormal EKG at the request of Wendie Agreste, MD. Patient's past medical history and cardiac risk factors include: Hypertension, hyperlipidemia, alcohol use, active smoking.  Patient presents to the office at the request of his primary care provider for evaluation of possible syncopal event in April 2021. It appears that the day prior to his event he had significant amount of alcohol intake according to his memory he had at least 5 cans of beer and four shots of liquor and a cookout on Sunday. The following day on Monday he went to work and after the 8:00 AM meeting patient went to lay down a set of boxes. It appears that the coworkers were unable to arouse him and therefore EMS was called for further evaluation. Patient does not recall losing consciousness. According to the patient when EMS arrived he vomited liquor in his symptoms were most likely attributed to dehydration and patient was taken to the ER for further evaluation. Patient was sent home until he recovered. Since then he has not had any other episodes.  Currently patient denies any chest pain or shortness of breath at rest or with effort related activities.  Denies prior history of coronary artery disease, myocardial infarction,  congestive heart failure, deep venous thrombosis, pulmonary embolism, stroke, transient ischemic attack.  FUNCTIONAL STATUS: Patient states his job is labor intensive and probably walks 5 miles at work on daily basis.    ALLERGIES: No Known Allergies  MEDICATION LIST PRIOR TO VISIT: Current Meds  Medication Sig  . amLODipine (NORVASC) 10 MG tablet Take 1 tablet (10 mg total) by mouth daily.  Marland Kitchen atorvastatin (LIPITOR) 20 MG tablet Take 1 tablet (20 mg total) by mouth daily.  . Blood Pressure Monitoring (BLOOD PRESSURE KIT) DEVI 1 Units by Does not apply route daily.  Marland Kitchen losartan-hydrochlorothiazide (HYZAAR) 100-25 MG tablet Take 1 tablet by mouth daily.  . metoprolol succinate (TOPROL-XL) 25 MG 24 hr tablet Take 1 tablet (25 mg total) by mouth daily.  . tamsulosin (FLOMAX) 0.4 MG CAPS capsule Take 1 capsule (0.4 mg total) by mouth daily.   Current Facility-Administered Medications for the 08/17/19 encounter (Office Visit) with Rex Kras, DO  Medication  . 0.9 %  sodium chloride infusion     PAST MEDICAL HISTORY: Past Medical History:  Diagnosis Date  . Allergy   . Hyperlipidemia   . Hypertension   . Personal history of colonic adenomas 06/07/2012   05/2012 - 2 adenomas max 10 mm  . Prostate cancer ([place]) 11/09/2016  . Syncope 2008    PAST SURGICAL HISTORY: Past Surgical History:  Procedure Laterality Date  . COLONOSCOPY  2014, 2017   polyps  . HEMORRHOID BANDING  2014  . HEMORRHOID SURGERY    . INGUINAL HERNIA REPAIR Right   .  PROSTATE BIOPSY      FAMILY HISTORY: The patient family history includes Diabetes in his mother; Hypertension in his mother; Lung cancer in his father; Throat cancer in his brother.  SOCIAL HISTORY:  The patient  reports that he has been smoking cigarettes. He has a 19.50 pack-year smoking history. He has never used smokeless tobacco. He reports current alcohol use of about 3.0 standard drinks of alcohol per week. He reports that he does not use  drugs.  REVIEW OF SYSTEMS: Review of Systems  Constitution: Negative for chills and fever.  HENT: Negative for hoarse voice and nosebleeds.   Eyes: Negative for discharge, double vision and pain.  Cardiovascular: Negative for chest pain, claudication, dyspnea on exertion, leg swelling, near-syncope, orthopnea, palpitations, paroxysmal nocturnal dyspnea and syncope.  Respiratory: Negative for hemoptysis and shortness of breath.   Musculoskeletal: Negative for muscle cramps and myalgias.  Gastrointestinal: Negative for abdominal pain, constipation, diarrhea, hematemesis, hematochezia, melena, nausea and vomiting.  Neurological: Negative for dizziness and light-headedness.    PHYSICAL EXAM: Vitals with BMI 08/17/2019 07/18/2019 07/18/2019  Height _0  - _1   Weight 199 lbs - 197 lbs 3 oz  BMI 60.45 - 40.98  Systolic 119 147 829  Diastolic 78 78 87  Pulse 85 - 69   Orthostatic VS for the past 72 hrs (Last 3 readings):  Orthostatic BP Patient Position BP Location Cuff Size Orthostatic Pulse  08/17/19 1020 125/86 Standing Left Arm Normal 104  08/17/19 1019 125/84 Sitting Left Arm Normal 99  08/17/19 1018 142/89 Supine Left Arm Normal 87    CONSTITUTIONAL: Well-developed and well-nourished. No acute distress.  SKIN: Skin is warm and dry. No rash noted. No cyanosis. No pallor. No jaundice HEAD: Normocephalic and atraumatic.  EYES: No scleral icterus MOUTH/THROAT: Moist oral membranes.  NECK: No JVD present. No thyromegaly noted. No carotid bruits  LYMPHATIC: No visible cervical adenopathy.  CHEST Normal respiratory effort. No intercostal retractions  LUNGS: Clear to auscultation bilaterally. No stridor. No wheezes. No rales.  CARDIOVASCULAR: Regular rate and rhythm, positive S1-S2, no murmurs rubs or gallops appreciated. ABDOMINAL: Soft, nontender, nondistended, positive bowel sounds all 4 quadrants. No apparent ascites.  EXTREMITIES: No peripheral edema  HEMATOLOGIC: No  significant bruising NEUROLOGIC: Oriented to person, place, and time. Nonfocal. Normal muscle tone.  PSYCHIATRIC: Normal mood and affect. Normal behavior. Cooperative  CARDIAC DATABASE: EKG: Unable to open the recent EKG due to [employer] downtime...  Echocardiogram: 09/19/2006: LVEF 60 %. There  were no left ventricular regional wall motion abnormalities. Left ventricular wall thickness was mildly increased. There was the appearance of a Chiari malformation.   Stress Testing: More than 5 years ago.   Heart Catheterization: None   LABORATORY DATA: CBC Latest Ref Rng & Units 09/13/2017 01/22/2017 12/26/2015  WBC 3.4 - 10.8 x10E3/uL 5.1 4.3 5.8  Hemoglobin 13.0 - 17.7 g/dL 13.5 14.3 14.5  Hematocrit 37.5 - 51.0 % 40.9 41.6 41.8  Platelets 150 - 450 x10E3/uL 256 310 281    CMP Latest Ref Rng & Units 07/18/2019 07/04/2019 11/17/2018  Glucose 65 - 99 mg/dL 75 134(H) 115(H)  BUN 8 - 27 mg/dL _2 Creatinine 0.76 - 1.27 mg/dL 0.91 1.01 1.06  Sodium 134 - 144 mmol/L 141 137 137  Potassium 3.5 - 5.2 mmol/L 4.3 4.0 3.9  Chloride 96 - 106 mmol/L 98 92(L) 94(L)  CO2 20 - 29 mmol/L _3 Calcium 8.6 - 10.2 mg/dL 10.1 10.3(H) 10.8(H)  Total Protein 6.0 -  8.5 g/dL 7.6 8.4 8.4  Total Bilirubin 0.0 - 1.2 mg/dL 0.4 0.6 0.5  Alkaline Phos 39 - 117 IU/L 68 84 65  AST 0 - 40 IU/L 130(H) 102(H) 91(H)  ALT 0 - 44 IU/L 95(H) 111(H) 54(H)    Lipid Panel     Component Value Date/Time   CHOL 184 07/18/2019 1132   TRIG 71 07/18/2019 1132   HDL 100 07/18/2019 1132   CHOLHDL 1.8 07/18/2019 1132   CHOLHDL 2.6 12/26/2015 1705   VLDL 13 12/26/2015 1705   LDLCALC 71 07/18/2019 1132   LABVLDL 13 07/18/2019 1132    Lab Results  Component Value Date   HGBA1C 5.6 07/04/2019   No components found for: NTPROBNP Lab Results  Component Value Date   TSH 1.170 07/04/2019   TSH 0.642 11/17/2018   TSH 2.240 09/13/2017    BMP Recent Labs    11/17/18 1157 07/04/19 1041 07/18/19 1132  NA 137 137  141  K 3.9 4.0 4.3  CL 94* 92* 98  CO2 _0 GLUCOSE 115* 134* 75  BUN _1 CREATININE 1.06 1.01 0.91  CALCIUM 10.8* 10.3* 10.1  GFRNONAA 76 80 91  GFRAA 88 93 106    CBC No results for input(s): WBC, RBC, HGB, HCT, PLT, MCV, MCH, MCHC, RDW, LYMPHSABS, MONOABS, EOSABS, BASOSABS in the last 168 hours.  Invalid input(s): NEUTRABS  HEMOGLOBIN A1C Lab Results  Component Value Date   HGBA1C 5.6 07/04/2019    Cardiac Panel (last 3 results) No results for input(s): CKTOTAL, CKMB, TROPONINI, RELINDX in the last 8760 hours. No results for input(s): TROPIPOC in the last 8760 hours.  BNP (last 3 results) No results for input(s): PROBNP in the last 8760 hours.  TSH Recent Labs    11/17/18 1157 07/04/19 1041  TSH 0.642 1.170    CHOLESTEROL Recent Labs    11/17/18 1157 07/18/19 1132  CHOL 186 184    Hepatic Function Panel Recent Labs    11/17/18 1157 07/04/19 1041 07/18/19 1132  PROT 8.4 8.4 7.6  ALBUMIN 5.3* 5.1* 4.9  AST 91* 102* 130*  ALT 54* 111* 95*  ALKPHOS 65 84 68  BILITOT 0.5 0.6 0.4    IMPRESSION:    ICD-10-CM   1. Syncope and collapse  R55 PCV CARDIAC STRESS TEST    PCV ECHOCARDIOGRAM COMPLETE    HOLTER MONITOR - 24 HOUR  2. Alcohol use  Z72.89   3. Cigarette smoker  F17.210   4. Benign hypertension  I10   5. Hypercholesterolemia  E78.00      RECOMMENDATIONS: Jeffrey Harrington is a 59 y.o. male whose past medical history and cardiac risk factors include: Hypertension, hyperlipidemia, alcohol use, active smoking.  Questionable episode of syncope.  Based on the history provided to the patient it does not appear the patient had lost consciousness during his event in April 2021.  His symptoms are most likely precipitated by significant alcohol consumption and underlying dehydration and vomiting.  However, given his cardiovascular risk factors would recommend an echocardiogram to evaluate structural heart disease GXT to evaluate for  exercise-induced ischemia 24-hour Holter monitor to screen for arrhythmias.  Orthostatic vital signs negative  Medications reconciled.  Hypertension: Improving.  Medications reconciled. Educated on the importance of a low-salt diet. Will reevaluate at the next office visit.  Hypercholesterolemia: Currently on statin therapy. Does not endorse any myalgias. Currently management primary team  Active cigarette smoking: Educated on importance of complete smoking cessation.  Alcohol consumption: Patient consumes more than the recommended amount of alcohol on daily basis. Patient is educated that drinking more than reccommended amount of alcohol will predispose him to medical conditions such as but not including cardiomyopathy, atrial fibrillation pathology.   FINAL MEDICATION LIST END OF ENCOUNTER: No orders of the defined types were placed in this encounter.   There are no discontinued medications.   Current Outpatient Medications:  .  amLODipine (NORVASC) 10 MG tablet, Take 1 tablet (10 mg total) by mouth daily., Disp: 90 tablet, Rfl: 1 .  atorvastatin (LIPITOR) 20 MG tablet, Take 1 tablet (20 mg total) by mouth daily., Disp: 90 tablet, Rfl: 3 .  Blood Pressure Monitoring (BLOOD PRESSURE KIT) DEVI, 1 Units by Does not apply route daily., Disp: 1 Device, Rfl: 0 .  losartan-hydrochlorothiazide (HYZAAR) 100-25 MG tablet, Take 1 tablet by mouth daily., Disp: 90 tablet, Rfl: 1 .  metoprolol succinate (TOPROL-XL) 25 MG 24 hr tablet, Take 1 tablet (25 mg total) by mouth daily., Disp: 90 tablet, Rfl: 1 .  tamsulosin (FLOMAX) 0.4 MG CAPS capsule, Take 1 capsule (0.4 mg total) by mouth daily., Disp: 30 capsule, Rfl: 3  Current Facility-Administered Medications:  .  0.9 %  sodium chloride infusion, 500 mL, Intravenous, Continuous, Gatha Mayer, MD  Orders Placed This Encounter  Procedures  . PCV CARDIAC STRESS TEST  . HOLTER MONITOR - 24 HOUR  . PCV ECHOCARDIOGRAM COMPLETE   --Continue  cardiac medications as reconciled in final medication list. --Return in about 6 weeks (around 09/28/2019) for re-evaluation of symptoms., review test results. EKG on arrival. Or sooner if needed. --Continue follow-up with your primary care physician regarding the management of your other chronic comorbid conditions.  Patient's questions and concerns were addressed to his satisfaction. He voices understanding of the instructions provided during this encounter.   This note was created using a voice recognition software as a result there may be grammatical errors inadvertently enclosed that do not reflect the nature of this encounter. Every attempt is made to correct such errors.  Rex Kras, [place], [facility] Pager: [phone] Office: [phone]

## 2019-08-17 ENCOUNTER — Other Ambulatory Visit: Payer: Self-pay

## 2019-08-17 ENCOUNTER — Ambulatory Visit: Payer: 59 | Admitting: Cardiology

## 2019-08-17 ENCOUNTER — Encounter: Payer: Self-pay | Admitting: Cardiology

## 2019-08-17 VITALS — BP 137/78 | HR 85 | Resp 18 | Ht 71.0 in | Wt 199.0 lb

## 2019-08-17 DIAGNOSIS — Z789 Other specified health status: Secondary | ICD-10-CM

## 2019-08-17 DIAGNOSIS — E7800 Pure hypercholesterolemia, unspecified: Secondary | ICD-10-CM

## 2019-08-17 DIAGNOSIS — I10 Essential (primary) hypertension: Secondary | ICD-10-CM

## 2019-08-17 DIAGNOSIS — R55 Syncope and collapse: Secondary | ICD-10-CM

## 2019-08-17 DIAGNOSIS — F17210 Nicotine dependence, cigarettes, uncomplicated: Secondary | ICD-10-CM

## 2019-10-02 ENCOUNTER — Ambulatory Visit: Payer: 59 | Admitting: Cardiology

## 2019-10-18 ENCOUNTER — Ambulatory Visit: Payer: 59 | Admitting: Family Medicine

## 2020-08-08 ENCOUNTER — Other Ambulatory Visit: Payer: Self-pay | Admitting: Family Medicine

## 2020-08-08 DIAGNOSIS — Z9189 Other specified personal risk factors, not elsewhere classified: Secondary | ICD-10-CM

## 2020-08-08 DIAGNOSIS — R000 Tachycardia, unspecified: Secondary | ICD-10-CM

## 2020-08-08 DIAGNOSIS — I10 Essential (primary) hypertension: Secondary | ICD-10-CM

## 2020-12-06 ENCOUNTER — Other Ambulatory Visit: Payer: Self-pay | Admitting: Family Medicine

## 2020-12-06 DIAGNOSIS — R000 Tachycardia, unspecified: Secondary | ICD-10-CM

## 2020-12-06 DIAGNOSIS — Z9189 Other specified personal risk factors, not elsewhere classified: Secondary | ICD-10-CM

## 2020-12-06 DIAGNOSIS — I10 Essential (primary) hypertension: Secondary | ICD-10-CM

## 2021-01-25 ENCOUNTER — Encounter: Payer: Self-pay | Admitting: Internal Medicine

## 2021-07-05 ENCOUNTER — Encounter (HOSPITAL_COMMUNITY): Payer: Self-pay | Admitting: Nurse Practitioner

## 2021-07-05 ENCOUNTER — Ambulatory Visit (HOSPITAL_COMMUNITY)
Admission: EM | Admit: 2021-07-05 | Discharge: 2021-07-05 | Disposition: A | Discharge status: Home / Self Care | Payer: BLUE CROSS/BLUE SHIELD | Attending: Nurse Practitioner | Admitting: Nurse Practitioner

## 2021-07-05 DIAGNOSIS — Z760 Encounter for issue of repeat prescription: Secondary | ICD-10-CM

## 2021-07-05 DIAGNOSIS — I10 Essential (primary) hypertension: Secondary | ICD-10-CM

## 2021-07-05 DIAGNOSIS — R000 Tachycardia, unspecified: Secondary | ICD-10-CM

## 2021-07-05 MED ORDER — AMLODIPINE BESYLATE 10 MG PO TABS: 10.0000 mg | ORAL_TABLET | Freq: Every day | ORAL | 1 refills | Status: DC

## 2021-07-05 MED ORDER — METOPROLOL SUCCINATE ER 25 MG PO TB24: 25.0000 mg | ORAL_TABLET | Freq: Every day | ORAL | 1 refills | Status: DC

## 2021-07-05 MED ORDER — LOSARTAN POTASSIUM-HCTZ 100-25 MG PO TABS: 1.0000 | ORAL_TABLET | Freq: Every day | ORAL | 1 refills | Status: DC

## 2021-07-05 NOTE — ED Triage Notes (Signed)
Pt reports that been out of HTN meds for 6 months and needs refilled. Doesn't have PCP anymore ?Amlodipine '10mg'$  ?Metoprolol '25mg'$   ?And one that starts with H ? ?Pt also needs note for being out of work today.  ?

## 2021-07-05 NOTE — ED Provider Notes (Signed)
?[place] ? ? ? ?CSN: [id number] ?Arrival date & time: 07/05/21  1017 ? ? ?  ? ?History   ?Chief Complaint ?Chief Complaint  ?Patient presents with  ? Medication Refill  ? Letter for School/Work  ? ? ?HPI ?Jeffrey Harrington is a 61 y.o. male.  ? ?The patient is a 61 year old male who presents for a medication filled for her medication.  States that he has been out of his medications for approximately 6 months.  States that he just got his insurance on his new job.  He denies headache, dizziness, blurred vision, chest pain, shortness of breath, difficulty breathing, or lower extremity edema.  He states that he does walk regularly at this time.  He was previously seen by Dr. Cindee Lame with cardiology.  He has a history of smoking, and alcohol use. ? ?The history is provided by the patient.  ? ?Past Medical History:  ?Diagnosis Date  ? Allergy   ? Hyperlipidemia   ? Hypertension   ? Personal history of colonic adenomas 06/07/2012  ? 05/2012 - 2 adenomas max 10 mm  ? Prostate cancer ([place]) 11/09/2016  ? Syncope 2008  ? ? ?Patient Active Problem List  ? Diagnosis Date Noted  ? Malignant neoplasm of prostate ([place]) 12/21/2016  ? HTN (hypertension) 10/02/2013  ? Personal history of colonic adenomas 06/07/2012  ? ? ?Past Surgical History:  ?Procedure Laterality Date  ? COLONOSCOPY  2014, 2017  ? polyps  ? HEMORRHOID BANDING  2014  ? HEMORRHOID SURGERY    ? INGUINAL HERNIA REPAIR Right   ? PROSTATE BIOPSY    ? ? ? ? ? ?Home Medications   ? ?Prior to Admission medications   ?Medication Sig Start Date End Date Taking? Authorizing Provider  ?amLODipine (NORVASC) 10 MG tablet Take 1 tablet (10 mg total) by mouth daily. 07/05/21 08/04/21  Lorali Khamis-Warren, Alda Lea, NP  ?atorvastatin (LIPITOR) 20 MG tablet Take 1 tablet (20 mg total) by mouth daily. 07/18/19   Wendie Agreste, MD  ?Blood Pressure Monitoring (BLOOD PRESSURE KIT) DEVI 1 Units by Does not apply route daily. 01/22/17   Tenna Delaine D, PA-C   ?losartan-hydrochlorothiazide (HYZAAR) 100-25 MG tablet Take 1 tablet by mouth daily. 07/05/21 08/04/21  Jamiere Gulas-Warren, Alda Lea, NP  ?metoprolol succinate (TOPROL-XL) 25 MG 24 hr tablet Take 1 tablet (25 mg total) by mouth daily. 07/05/21 08/04/21  Tobe Kervin-Warren, Alda Lea, NP  ?tamsulosin (FLOMAX) 0.4 MG CAPS capsule Take 1 capsule (0.4 mg total) by mouth daily. 11/17/18   Wendie Agreste, MD  ? ? ?Family History ?Family History  ?Problem Relation Age of Onset  ? Diabetes Mother   ? Hypertension Mother   ? Lung cancer Father   ? Throat cancer Brother   ?     throat/found in lymph nodes  ? Colon cancer Neg Hx   ? Colon polyps Neg Hx   ? Esophageal cancer Neg Hx   ? Rectal cancer Neg Hx   ? Stomach cancer Neg Hx   ? ? ?Social History ?Social History  ? ?Tobacco Use  ? Smoking status: Every Day  ?  Packs/day: 0.50  ?  Years: 39.00  ?  Pack years: 19.50  ?  Types: Cigarettes  ? Smokeless tobacco: Never  ?Vaping Use  ? Vaping Use: Never used  ?Substance Use Topics  ? Alcohol use: Yes  ?  Alcohol/week: 3.0 standard drinks  ?  Types: 3 Standard drinks or equivalent per week  ?  Comment: 2 per day  ? Drug use: No  ? ? ? ?Allergies   ?Patient has no known allergies. ? ? ?Review of Systems ?Review of Systems  ?Constitutional: Negative.  Negative for diaphoresis.  ?Respiratory: Negative.    ?Cardiovascular: Negative.   ?Gastrointestinal: Negative.   ?Skin: Negative.   ?Neurological: Negative.   ?Psychiatric/Behavioral: Negative.    ? ? ?Physical Exam ?Triage Vital Signs ?ED Triage Vitals [07/05/21 1143]  ?Enc Vitals Group  ?   BP (!) 193/115  ?   Pulse Rate 86  ?   Resp 18  ?   Temp 98.4 ?F (36.9 ?C)  ?   Temp src   ?   SpO2 96 %  ?   Weight   ?   Height   ?   Head Circumference   ?   Peak Flow   ?   Pain Score 0  ?   Pain Loc   ?   Pain Edu?   ?   Excl. in [place]?   ? ?No data found. ? ?Updated Vital Signs ?BP (!) 193/115 (BP Location: Left Arm)   Pulse 86   Temp 98.4 ?F (36.9 ?C)   Resp 18   SpO2 96%  ? ?Visual  Acuity ?Right Eye Distance:   ?Left Eye Distance:   ?Bilateral Distance:   ? ?Right Eye Near:   ?Left Eye Near:    ?Bilateral Near:    ? ?Physical Exam ?Vitals reviewed.  ?Constitutional:   ?   General: He is not in acute distress. ?   Appearance: Normal appearance.  ?HENT:  ?   Head: Normocephalic and atraumatic.  ?Cardiovascular:  ?   Rate and Rhythm: Normal rate and regular rhythm.  ?   Pulses: Normal pulses.  ?   Heart sounds: Normal heart sounds.  ?Pulmonary:  ?   Effort: Pulmonary effort is normal.  ?   Breath sounds: Normal breath sounds.  ?Abdominal:  ?   General: Bowel sounds are normal.  ?   Palpations: Abdomen is soft.  ?Skin: ?   General: Skin is warm and dry.  ?   Capillary Refill: Capillary refill takes less than 2 seconds.  ?Neurological:  ?   General: No focal deficit present.  ?   Mental Status: He is alert and oriented to person, place, and time.  ?Psychiatric:     ?   Mood and Affect: Mood normal.     ?   Behavior: Behavior normal.  ? ? ? ?UC Treatments / Results  ?Labs ?(all labs ordered are listed, but only abnormal results are displayed) ?Labs Reviewed - No data to display ? ?EKG ? ? ?Radiology ?No results found. ? ?Procedures ?Procedures (including critical care time) ? ?Medications Ordered in UC ?Medications - No data to display ? ?Initial Impression / Assessment and Plan / UC Course  ?I have reviewed the triage vital signs and the nursing notes. ? ?Pertinent labs & imaging results that were available during my care of the patient were reviewed by me and considered in my medical decision making (see chart for details). ? ?The patient is a 61 year old male who presents for a medication refill for his high blood pressure medication.  Patient's been out of his medicine for the past 6 months.  Patient states that he just acquired new insurance at his job.  He denies any chest pain, shortness of breath, difficulty breathing, headache, blurred vision, or dizziness.  He was previously seen by  cardiology  for syncope and hypertension management.  Patient was advised to follow-up with Dr. Rolly Salter office to have continued management of his hypertension.  He does not have any symptoms, is just requiring a medication refill.  Patient advised to go to the ER if he becomes symptomatic to include headache, dizziness, chest pain, shortness of breath, or other concerns. ?Final Clinical Impressions(s) / UC Diagnoses  ? ?Final diagnoses:  ?Essential hypertension  ?Tachycardia  ? ? ? ?Discharge Instructions   ? ?  ?Take medication as prescribed. ?You need to contact Dr. Su Hilt office, your primary care physician, and [facility] cardiology.  Please call within the next week to make an appointment. ?Continue to monitor your diet for sodium intake. ?Continue regular exercise. ?Follow-up in the ER if you develop headache, dizziness, chest pain, shortness of breath, or other concerns. ? ? ? ? ?ED Prescriptions   ? ? Medication Sig Dispense Auth. Provider  ? amLODipine (NORVASC) 10 MG tablet Take 1 tablet (10 mg total) by mouth daily. 30 tablet Nejla Reasor-Warren, Alda Lea, NP  ? losartan-hydrochlorothiazide (HYZAAR) 100-25 MG tablet Take 1 tablet by mouth daily. 30 tablet Symphani Eckstrom-Warren, Alda Lea, NP  ? metoprolol succinate (TOPROL-XL) 25 MG 24 hr tablet Take 1 tablet (25 mg total) by mouth daily. 30 tablet Amaury Kuzel-Warren, Alda Lea, NP  ? ?  ? ?PDMP not reviewed this encounter. ?  ?Tish Men, NP ?07/05/21 1214 ? ?

## 2021-07-05 NOTE — Discharge Instructions (Addendum)
Take medication as prescribed. ?You need to contact Dr. Su Hilt office, your primary care physician, and [facility] cardiology.  Please call within the next week to make an appointment. ?Continue to monitor your diet for sodium intake. ?Continue regular exercise. ?Follow-up in the ER if you develop headache, dizziness, chest pain, shortness of breath, or other concerns. ?

## 2021-11-09 ENCOUNTER — Ambulatory Visit (HOSPITAL_COMMUNITY)
Admission: EM | Admit: 2021-11-09 | Discharge: 2021-11-09 | Disposition: A | Discharge status: Home / Self Care | Payer: BLUE CROSS/BLUE SHIELD | Attending: Family Medicine | Admitting: Family Medicine

## 2021-11-09 ENCOUNTER — Other Ambulatory Visit: Payer: Self-pay

## 2021-11-09 ENCOUNTER — Encounter (HOSPITAL_COMMUNITY): Payer: Self-pay | Admitting: *Deleted

## 2021-11-09 DIAGNOSIS — R000 Tachycardia, unspecified: Secondary | ICD-10-CM | POA: Diagnosis not present

## 2021-11-09 DIAGNOSIS — I10 Essential (primary) hypertension: Secondary | ICD-10-CM

## 2021-11-09 MED ORDER — LOSARTAN POTASSIUM-HCTZ 100-25 MG PO TABS: 1.0000 | ORAL_TABLET | Freq: Every day | ORAL | 2 refills | Status: DC

## 2021-11-09 MED ORDER — BACLOFEN 10 MG PO TABS: 10.0000 mg | ORAL_TABLET | Freq: Three times a day (TID) | ORAL | 0 refills | Status: AC | PRN

## 2021-11-09 MED ORDER — OMEPRAZOLE 40 MG PO CPDR: 40.0000 mg | DELAYED_RELEASE_CAPSULE | Freq: Every day | ORAL | 2 refills | Status: DC

## 2021-11-09 MED ORDER — METOPROLOL SUCCINATE ER 25 MG PO TB24: 25.0000 mg | ORAL_TABLET | Freq: Every day | ORAL | 2 refills | Status: DC

## 2021-11-09 MED ORDER — AMLODIPINE BESYLATE 10 MG PO TABS: 10.0000 mg | ORAL_TABLET | Freq: Every day | ORAL | 2 refills | Status: DC

## 2021-11-09 NOTE — ED Triage Notes (Signed)
Pt reports he needs a refill on BP meds. Pt also reports He buried his wife on Friday and has had hiccups since then . Pt wants to know if there is any thing he can take for the hiccups.

## 2021-11-09 NOTE — ED Provider Notes (Signed)
[place]    CSN: [id number] Arrival date & time: 11/09/21  1216      History   Chief Complaint Chief Complaint  Patient presents with   Medication Refill    HPI Jeffrey Harrington is a 61 y.o. male.    Medication Refill  Here for hypertension.  He has still not been able to establish with a PCP, as his wife has been ill with a brain tumor.  She just passed away and he comes in today for new prescriptions of his blood pressure medications.  Since he had a funeral on the 18th, he has been hiccuping.  He has occasionally thrown up from the hiccuping.  No fever or cough.  Past Medical History:  Diagnosis Date   Allergy    Hyperlipidemia    Hypertension    Personal history of colonic adenomas 06/07/2012   05/2012 - 2 adenomas max 10 mm   Prostate cancer ([place]) 11/09/2016   Syncope 2008    Patient Active Problem List   Diagnosis Date Noted   Malignant neoplasm of prostate ([place]) 12/21/2016   HTN (hypertension) 10/02/2013   Personal history of colonic adenomas 06/07/2012    Past Surgical History:  Procedure Laterality Date   COLONOSCOPY  2014, 2017   polyps   HEMORRHOID BANDING  2014   HEMORRHOID SURGERY     INGUINAL HERNIA REPAIR Right    PROSTATE BIOPSY         Home Medications    Prior to Admission medications   Medication Sig Start Date End Date Taking? Authorizing Provider  baclofen (LIORESAL) 10 MG tablet Take 1 tablet (10 mg total) by mouth 3 (three) times daily as needed (hiccups). 11/09/21  Yes Barrett Henle, MD  omeprazole (PRILOSEC) 40 MG capsule Take 1 capsule (40 mg total) by mouth daily. 11/09/21  Yes Barrett Henle, MD  amLODipine (NORVASC) 10 MG tablet Take 1 tablet (10 mg total) by mouth daily. 11/09/21   Barrett Henle, MD  atorvastatin (LIPITOR) 20 MG tablet Take 1 tablet (20 mg total) by mouth daily. 07/18/19   Wendie Agreste, MD  Blood Pressure Monitoring (BLOOD PRESSURE KIT) DEVI 1 Units by Does not apply route daily.  01/22/17   Tenna Delaine D, PA-C  losartan-hydrochlorothiazide (HYZAAR) 100-25 MG tablet Take 1 tablet by mouth daily. 11/09/21   Barrett Henle, MD  metoprolol succinate (TOPROL-XL) 25 MG 24 hr tablet Take 1 tablet (25 mg total) by mouth daily. 11/09/21   Barrett Henle, MD  tamsulosin (FLOMAX) 0.4 MG CAPS capsule Take 1 capsule (0.4 mg total) by mouth daily. 11/17/18   Wendie Agreste, MD    Family History Family History  Problem Relation Age of Onset   Diabetes Mother    Hypertension Mother    Lung cancer Father    Throat cancer Brother        throat/found in lymph nodes   Colon cancer Neg Hx    Colon polyps Neg Hx    Esophageal cancer Neg Hx    Rectal cancer Neg Hx    Stomach cancer Neg Hx     Social History Social History   Tobacco Use   Smoking status: Every Day    Packs/day: 0.50    Years: 39.00    Total pack years: 19.50    Types: Cigarettes   Smokeless tobacco: Never  Vaping Use   Vaping Use: Never used  Substance Use Topics   Alcohol use: Yes  Alcohol/week: 3.0 standard drinks of alcohol    Types: 3 Standard drinks or equivalent per week    Comment: 2 per day   Drug use: No     Allergies   Patient has no known allergies.   Review of Systems Review of Systems   Physical Exam Triage Vital Signs ED Triage Vitals  Enc Vitals Group     BP 11/09/21 1231 (!) 141/91     Pulse Rate 11/09/21 1231 (!) 108     Resp 11/09/21 1231 20     Temp 11/09/21 1231 98.6 F (37 C)     Temp src --      SpO2 11/09/21 1231 97 %     Weight --      Height --      Head Circumference --      Peak Flow --      Pain Score 11/09/21 1228 0     Pain Loc --      Pain Edu? --      Excl. in [place]? --    No data found.  Updated Vital Signs BP (!) 141/91   Pulse (!) 108   Temp 98.6 F (37 C)   Resp 20   SpO2 97%   Visual Acuity Right Eye Distance:   Left Eye Distance:   Bilateral Distance:    Right Eye Near:   Left Eye Near:    Bilateral Near:      Physical Exam Vitals reviewed.  Constitutional:      General: He is not in acute distress.    Appearance: He is not ill-appearing, toxic-appearing or diaphoretic.  HENT:     Mouth/Throat:     Mouth: Mucous membranes are moist.  Eyes:     Extraocular Movements: Extraocular movements intact.     Pupils: Pupils are equal, round, and reactive to light.  Cardiovascular:     Rate and Rhythm: Normal rate and regular rhythm.     Heart sounds: No murmur heard. Pulmonary:     Effort: Pulmonary effort is normal.     Breath sounds: Normal breath sounds.  Abdominal:     Palpations: Abdomen is soft.     Tenderness: There is no abdominal tenderness.  Musculoskeletal:     Cervical back: Neck supple.  Lymphadenopathy:     Cervical: No cervical adenopathy.  Skin:    Coloration: Skin is not jaundiced or pale.  Neurological:     General: No focal deficit present.     Mental Status: He is alert and oriented to person, place, and time.  Psychiatric:        Behavior: Behavior normal.      UC Treatments / Results  Labs (all labs ordered are listed, but only abnormal results are displayed) Labs Reviewed - No data to display  EKG   Radiology No results found.  Procedures Procedures (including critical care time)  Medications Ordered in UC Medications - No data to display  Initial Impression / Assessment and Plan / UC Course  I have reviewed the triage vital signs and the nursing notes.  Pertinent labs & imaging results that were available during my care of the patient were reviewed by me and considered in my medical decision making (see chart for details).     He has 3 blood pressure medications are sent in.  I have sent in omeprazole 40 mg 1 daily for him to start first.  If that is not helpful then he will take the baclofen  as needed.  I showed him how to schedule a new patient appointment on the [facility] health website. Final Clinical Impressions(s) / UC Diagnoses   Final  diagnoses:  Essential hypertension, benign     Discharge Instructions      Take omeprazole 40 mg 1 daily; this is for stomach acid, and hopefully will help the hiccups.  If the hiccups persist, take baclofen as needed for the hiccups.  Take amlodipine 10 mg 1 daily for blood pressure  Take losartanHCT 100-25 mg--1 daily for blood pressure  Take metoprolol 25 mg --1 daily for blood pressure.  You can use the QR code/website at the back of the summary paperwork to schedule yourself a new patient appointment with primary care  It is important that you establish with a primary care to follow your blood pressure.    ED Prescriptions     Medication Sig Dispense Auth. Provider   amLODipine (NORVASC) 10 MG tablet Take 1 tablet (10 mg total) by mouth daily. 30 tablet Aerielle Stoklosa, Gwenlyn Perking, MD   losartan-hydrochlorothiazide (HYZAAR) 100-25 MG tablet Take 1 tablet by mouth daily. 30 tablet Barrett Henle, MD   metoprolol succinate (TOPROL-XL) 25 MG 24 hr tablet Take 1 tablet (25 mg total) by mouth daily. 30 tablet Barrett Henle, MD   omeprazole (PRILOSEC) 40 MG capsule Take 1 capsule (40 mg total) by mouth daily. 30 capsule Barrett Henle, MD   baclofen (LIORESAL) 10 MG tablet Take 1 tablet (10 mg total) by mouth 3 (three) times daily as needed (hiccups). 30 each Barrett Henle, MD      PDMP not reviewed this encounter.   Barrett Henle, MD 11/09/21 [phone]

## 2021-11-09 NOTE — Discharge Instructions (Addendum)
Take omeprazole 40 mg 1 daily; this is for stomach acid, and hopefully will help the hiccups.  If the hiccups persist, take baclofen as needed for the hiccups.  Take amlodipine 10 mg 1 daily for blood pressure  Take losartanHCT 100-25 mg--1 daily for blood pressure  Take metoprolol 25 mg --1 daily for blood pressure.  You can use the QR code/website at the back of the summary paperwork to schedule yourself a new patient appointment with primary care  It is important that you establish with a primary care to follow your blood pressure.

## 2022-10-30 ENCOUNTER — Encounter: Payer: Self-pay | Admitting: Family Medicine

## 2022-10-30 ENCOUNTER — Telehealth: Payer: BLUE CROSS/BLUE SHIELD | Admitting: Family Medicine

## 2022-10-30 DIAGNOSIS — R1310 Dysphagia, unspecified: Secondary | ICD-10-CM

## 2022-10-30 NOTE — Progress Notes (Signed)
Virtual Visit Consent   Jeffrey Harrington, you are scheduled for a virtual visit with a [facility] provider today. Just as with appointments in the office, your consent must be obtained to participate. Your consent will be active for this visit and any virtual visit you may have with one of our providers in the next 365 days. If you have a MyChart account, a copy of this consent can be sent to you electronically.  As this is a virtual visit, video technology does not allow for your provider to perform a traditional examination. This may limit your provider's ability to fully assess your condition. If your provider identifies any concerns that need to be evaluated in person or the need to arrange testing (such as labs, EKG, etc.), we will make arrangements to do so. Although advances in technology are sophisticated, we cannot ensure that it will always work on either your end or our end. If the connection with a video visit is poor, the visit may have to be switched to a telephone visit. With either a video or telephone visit, we are not always able to ensure that we have a secure connection.  By engaging in this virtual visit, you consent to the provision of healthcare and authorize for your insurance to be billed (if applicable) for the services provided during this visit. Depending on your insurance coverage, you may receive a charge related to this service.  I need to obtain your verbal consent now. Are you willing to proceed with your visit today? Jeffrey Harrington has provided verbal consent on 10/30/2022 for a virtual visit (video or telephone). Reed Pandy, [place]  Date: 10/30/2022 6:09 PM  Virtual Visit via Video Note   I, Reed Pandy, connected with  Jeffrey Harrington  ([id number], [birth year]) on 10/30/22 at  6:00 PM EDT by a video-enabled telemedicine application and verified that I am speaking with the correct person using two identifiers.  Location: Patient: Virtual Visit Location Patient:  Home Provider: Virtual Visit Location Provider: Home Office   I discussed the limitations of evaluation and management by telemedicine and the availability of in person appointments. The patient expressed understanding and agreed to proceed.    History of Present Illness: Jeffrey Harrington is a 62 y.o. who identifies as a male who was assigned male at birth, and is being seen today for c/o throat bothering him real bad for two weeks.  Pt states doing over the counter medications but have not been helpful.  Pt taking cold flu and sore throat medicine.  Pt states he has pain with swallowing.  Pt denies fever or chills.  Pt states food seems like food is getting stuck in throat and feeling like he has throw up. Pt states has had an episode of vomiting because food is not going down all the way.  Pt states he has trouble with swallowing food, but not fluids.  Pt denies swelling of tonsils.   HPI: HPI  Problems:  Patient Active Problem List   Diagnosis Date Noted   Malignant neoplasm of prostate ([facility]) 12/21/2016   HTN (hypertension) 10/02/2013   Personal history of colonic adenomas 06/07/2012    Allergies: No Known Allergies Medications:  Current Outpatient Medications:    amLODipine (NORVASC) 10 MG tablet, Take 1 tablet (10 mg total) by mouth daily., Disp: 30 tablet, Rfl: 2   atorvastatin (LIPITOR) 20 MG tablet, Take 1 tablet (20 mg total) by mouth daily., Disp: 90 tablet, Rfl: 3   baclofen (  LIORESAL) 10 MG tablet, Take 1 tablet (10 mg total) by mouth 3 (three) times daily as needed (hiccups)., Disp: 30 each, Rfl: 0   Blood Pressure Monitoring (BLOOD PRESSURE KIT) DEVI, 1 Units by Does not apply route daily., Disp: 1 Device, Rfl: 0   losartan-hydrochlorothiazide (HYZAAR) 100-25 MG tablet, Take 1 tablet by mouth daily., Disp: 30 tablet, Rfl: 2   metoprolol succinate (TOPROL-XL) 25 MG 24 hr tablet, Take 1 tablet (25 mg total) by mouth daily., Disp: 30 tablet, Rfl: 2   omeprazole (PRILOSEC) 40 MG  capsule, Take 1 capsule (40 mg total) by mouth daily., Disp: 30 capsule, Rfl: 2   tamsulosin (FLOMAX) 0.4 MG CAPS capsule, Take 1 capsule (0.4 mg total) by mouth daily., Disp: 30 capsule, Rfl: 3  Observations/Objective: Patient is well-developed, well-nourished in no acute distress.  Resting comfortably at home.  Head is normocephalic, atraumatic.  No labored breathing.  Speech is clear and coherent with logical content.  Patient is alert and oriented at baseline.    Assessment and Plan: 1. Dysphagia, unspecified type  -Advised Pt to make an appointment with PCP for issues with swallowing -Pt stated he would call on Monday to make an appointment.  -Also advised patient he can also go to his nearest urgent care for further evaluation.   Follow Up Instructions: I discussed the assessment and treatment plan with the patient. The patient was provided an opportunity to ask questions and all were answered. The patient agreed with the plan and demonstrated an understanding of the instructions.  A copy of instructions were sent to the patient via MyChart unless otherwise noted below.     The patient was advised to call back or seek an in-person evaluation if the symptoms worsen or if the condition fails to improve as anticipated.  Time:  I spent 10 minutes with the patient via telehealth technology discussing the above problems/concerns.    Reed Pandy, PA-C

## 2022-10-30 NOTE — Patient Instructions (Signed)
Ronnette Juniper, thank you for joining Reed Pandy, PA-C for today's virtual visit.  While this provider is not your primary care provider (PCP), if your PCP is located in our provider database this encounter information will be shared with them immediately following your visit.   A [facility] MyChart account gives you access to today's visit and all your visits, tests, and labs performed at [facility] " click here if you don't have a [facility] MyChart account or go to [email or web address]  Consent: (Patient) Ronnette Juniper provided verbal consent for this virtual visit at the beginning of the encounter.  Current Medications:  Current Outpatient Medications:    amLODipine (NORVASC) 10 MG tablet, Take 1 tablet (10 mg total) by mouth daily., Disp: 30 tablet, Rfl: 2   atorvastatin (LIPITOR) 20 MG tablet, Take 1 tablet (20 mg total) by mouth daily., Disp: 90 tablet, Rfl: 3   baclofen (LIORESAL) 10 MG tablet, Take 1 tablet (10 mg total) by mouth 3 (three) times daily as needed (hiccups)., Disp: 30 each, Rfl: 0   Blood Pressure Monitoring (BLOOD PRESSURE KIT) DEVI, 1 Units by Does not apply route daily., Disp: 1 Device, Rfl: 0   losartan-hydrochlorothiazide (HYZAAR) 100-25 MG tablet, Take 1 tablet by mouth daily., Disp: 30 tablet, Rfl: 2   metoprolol succinate (TOPROL-XL) 25 MG 24 hr tablet, Take 1 tablet (25 mg total) by mouth daily., Disp: 30 tablet, Rfl: 2   omeprazole (PRILOSEC) 40 MG capsule, Take 1 capsule (40 mg total) by mouth daily., Disp: 30 capsule, Rfl: 2   tamsulosin (FLOMAX) 0.4 MG CAPS capsule, Take 1 capsule (0.4 mg total) by mouth daily., Disp: 30 capsule, Rfl: 3   Medications ordered in this encounter:  No orders of the defined types were placed in this encounter.    *If you need refills on other medications prior to your next appointment, please contact your pharmacy*  Follow-Up: Call back or seek an in-person evaluation if the symptoms worsen or if the  condition fails to improve as anticipated.  [facility] [phone]  Other Instructions Dysphagia  Dysphagia is trouble swallowing. This condition occurs when solids and liquids stick in a person's throat on the way down to the stomach, or when food takes longer to get to the stomach than usual. You may have problems swallowing food, liquids, or both. You may also have pain while trying to swallow. It may take you more time and effort to swallow something. What are the causes? This condition may be caused by: Muscle problems. These may make it difficult for you to move food and liquids through the esophagus, which is the tube that connects your mouth to your stomach. Blockages. You may have ulcers, scar tissue, or inflammation that blocks the normal passage of food and liquids. Causes of these problems include: Acid reflux from your stomach into your esophagus (gastroesophageal reflux). Infections. Radiation treatment for cancer. Medicines taken without enough fluids to wash them down into your stomach. Stroke. This can affect the nerves and make it difficult to swallow. Nerve problems. These prevent signals from being sent to the muscles of your esophagus to squeeze (contract) and move what you swallow down to your stomach. Globus pharyngeus. This is a common problem that involves a feeling like something is stuck in your throat or a sense of trouble with swallowing, even though nothing is wrong with the swallowing passages. Certain conditions, such as cerebral palsy or Parkinson's disease. What are the signs or  symptoms? Common symptoms of this condition include: A feeling that solids or liquids are stuck in your throat on the way down to the stomach. Pain while swallowing. Coughing or gagging while trying to swallow. Other symptoms include: Food moving back from your stomach to your mouth (regurgitation). Noises coming from your throat. Chest discomfort when  swallowing. A feeling of fullness when swallowing. Drooling, especially when the throat is blocked. Heartburn. How is this diagnosed? This condition may be diagnosed by: Barium swallow X-ray. In this test, you will swallow a white liquid that sticks to the inside of your esophagus. X-ray images are then taken. Endoscopy. In this test, a flexible telescope is inserted down your throat to look at your esophagus and your stomach. CT scans or an MRI. How is this treated? Treatment for dysphagia depends on the cause of this condition: If the dysphagia is caused by acid reflux or infection, medicines may be used. These may include antibiotics or heartburn medicines. If the dysphagia is caused by problems with the muscles, swallowing therapy may be used to help you strengthen your swallowing muscles. You may have to do specific exercises to strengthen the muscles or stretch them. If the dysphagia is caused by a blockage or mass, procedures to remove the blockage may be done. You may need surgery and a feeding tube. You may need to make diet changes. Ask your health care provider for specific instructions. Follow these instructions at home: Medicines Take over-the-counter and prescription medicines only as told by your health care provider. If you were prescribed an antibiotic medicine, take it as told by your health care provider. Do not stop taking the antibiotic even if you start to feel better. Eating and drinking  Make any diet changes as told by your health care provider. Work with a diet and nutrition specialist (dietitian) to create an eating plan that will help you get the nutrients you need in order to stay healthy. Eat soft foods that are easier to swallow. Cut your food into small pieces and eat slowly. Take small bites. Eat and drink only when you are sitting upright. Do not drink alcohol or caffeine. If you need help quitting, ask your health care provider. General  instructions Check your weight every day to make sure you are not losing weight. Do not use any products that contain nicotine or tobacco. These products include cigarettes, chewing tobacco, and vaping devices, such as e-cigarettes. If you need help quitting, ask your health care provider. Keep all follow-up visits. This is important. Contact a health care provider if: You lose weight because you cannot swallow. You cough when you drink liquids. You cough up partially digested food. Get help right away if: You cannot swallow your saliva. You have shortness of breath, a fever, or both. Your voice is hoarse and you have trouble swallowing. These symptoms may represent a serious problem that is an emergency. Do not wait to see if the symptoms will go away. Get medical help right away. Call your local emergency services (911 in the [place] Do not drive yourself to the hospital. Summary Dysphagia is trouble swallowing. This condition occurs when solids and liquids stick in a person's throat on the way down to the stomach. You may cough or gag while trying to swallow. Dysphagia has many possible causes. Treatment for dysphagia depends on the cause of the condition. Keep all follow-up visits. This is important. This information is not intended to replace advice given to you by your health care  provider. Make sure you discuss any questions you have with your health care provider. Document Revised: 10/27/2019 Document Reviewed: 10/27/2019 Elsevier Patient Education  2024 Elsevier Inc.    If you have been instructed to have an in-person evaluation today at a local Urgent Care facility, please use the link below. It will take you to a list of all of our available [facility], including address, phone number and hours of operation. Please do not delay care.  [facility]  If you or a family member do not have a primary care provider, use the link below to schedule a visit and  establish care. When you choose a [facility] primary care physician or advanced practice provider, you gain a long-term partner in health. Find a Primary Care Provider  Learn more about [facility] in-office and virtual care options:  - Get Care Now

## 2023-02-06 DIAGNOSIS — Z720 Tobacco use: Secondary | ICD-10-CM | POA: Diagnosis not present

## 2023-02-06 DIAGNOSIS — R32 Unspecified urinary incontinence: Secondary | ICD-10-CM | POA: Diagnosis not present

## 2023-02-06 DIAGNOSIS — I10 Essential (primary) hypertension: Secondary | ICD-10-CM | POA: Diagnosis not present

## 2023-02-06 DIAGNOSIS — Z9181 History of falling: Secondary | ICD-10-CM | POA: Diagnosis not present

## 2023-02-06 DIAGNOSIS — Z5986 Financial insecurity: Secondary | ICD-10-CM | POA: Diagnosis not present

## 2023-02-06 DIAGNOSIS — Z833 Family history of diabetes mellitus: Secondary | ICD-10-CM | POA: Diagnosis not present

## 2023-02-06 DIAGNOSIS — Z809 Family history of malignant neoplasm, unspecified: Secondary | ICD-10-CM | POA: Diagnosis not present

## 2023-02-24 ENCOUNTER — Other Ambulatory Visit: Payer: Self-pay

## 2023-02-24 ENCOUNTER — Encounter (HOSPITAL_COMMUNITY): Payer: Self-pay

## 2023-02-24 ENCOUNTER — Emergency Department (HOSPITAL_COMMUNITY)
Admission: EM | Admit: 2023-02-24 | Discharge: 2023-02-24 | Disposition: A | Discharge status: Home / Self Care | Payer: 59 | Attending: Emergency Medicine | Admitting: Emergency Medicine

## 2023-02-24 ENCOUNTER — Emergency Department (HOSPITAL_COMMUNITY): Payer: 59

## 2023-02-24 DIAGNOSIS — R202 Paresthesia of skin: Secondary | ICD-10-CM | POA: Diagnosis not present

## 2023-02-24 DIAGNOSIS — W19XXXA Unspecified fall, initial encounter: Secondary | ICD-10-CM | POA: Diagnosis not present

## 2023-02-24 DIAGNOSIS — R42 Dizziness and giddiness: Secondary | ICD-10-CM | POA: Diagnosis not present

## 2023-02-24 DIAGNOSIS — R55 Syncope and collapse: Secondary | ICD-10-CM | POA: Diagnosis not present

## 2023-02-24 DIAGNOSIS — E860 Dehydration: Secondary | ICD-10-CM | POA: Insufficient documentation

## 2023-02-24 DIAGNOSIS — R231 Pallor: Secondary | ICD-10-CM | POA: Diagnosis not present

## 2023-02-24 HISTORY — DX: Unspecified osteoarthritis, unspecified site: M19.90

## 2023-02-24 LAB — CBC WITH DIFFERENTIAL/PLATELET
Abs Immature Granulocytes: 0.01 10*3/uL (ref 0.00–0.07)
Basophils Absolute: 0.1 10*3/uL (ref 0.0–0.1)
Basophils Relative: 1 %
Eosinophils Absolute: 0 10*3/uL (ref 0.0–0.5)
Eosinophils Relative: 1 %
HCT: 43 % (ref 39.0–52.0)
Hemoglobin: 13.9 g/dL (ref 13.0–17.0)
Immature Granulocytes: 0 %
Lymphocytes Relative: 24 %
Lymphs Abs: 1 10*3/uL (ref 0.7–4.0)
MCH: 27.8 pg (ref 26.0–34.0)
MCHC: 32.3 g/dL (ref 30.0–36.0)
MCV: 86 fL (ref 80.0–100.0)
Monocytes Absolute: 0.6 10*3/uL (ref 0.1–1.0)
Monocytes Relative: 15 %
Neutro Abs: 2.5 10*3/uL (ref 1.7–7.7)
Neutrophils Relative %: 59 %
Platelets: 281 10*3/uL (ref 150–400)
RBC: 5 MIL/uL (ref 4.22–5.81)
RDW: 15.1 % (ref 11.5–15.5)
WBC: 4.2 10*3/uL (ref 4.0–10.5)
nRBC: 0 % (ref 0.0–0.2)

## 2023-02-24 LAB — BASIC METABOLIC PANEL
Anion gap: 19 — ABNORMAL HIGH (ref 5–15)
BUN: 17 mg/dL (ref 8–23)
CO2: 22 mmol/L (ref 22–32)
Calcium: 9.3 mg/dL (ref 8.9–10.3)
Chloride: 92 mmol/L — ABNORMAL LOW (ref 98–111)
Creatinine, Ser: 1.41 mg/dL — ABNORMAL HIGH (ref 0.61–1.24)
GFR, Estimated: 56 mL/min — ABNORMAL LOW (ref >60)
Glucose, Bld: 87 mg/dL (ref 70–99)
Potassium: 3.4 mmol/L — ABNORMAL LOW (ref 3.5–5.1)
Sodium: 133 mmol/L — ABNORMAL LOW (ref 135–145)

## 2023-02-24 LAB — MAGNESIUM: Magnesium: 1.8 mg/dL (ref 1.7–2.4)

## 2023-02-24 NOTE — ED Provider Notes (Signed)
[name] EMERGENCY DEPARTMENT AT [facility] Provider Note   CSN: [id number] Arrival date & time: 02/24/23  1017     History  Chief Complaint  Patient presents with   Near Syncope    Jeffrey Harrington is a 62 y.o. male.  62 yo M with a chief complaints of near syncopal event.  He said that he was at work at a meeting and he was standing and recently felt the tingling at the top of his head felt sweaty and collapsed to the ground.  He does not think that he fully lost consciousness.  Has been coughing congested for the past couple days.  He otherwise denies headaches denies neck pain denies chest pain abdominal pain.  Denies one-sided numbness or weakness denies difficulty speech or swallowing.  He denies any injury in the fall.  He tells me he actually had a similar event maybe a month ago.  He was standing and then suddenly felt similar got sweaty and then collapsed.   Near Syncope       Home Medications Prior to Admission medications   Medication Sig Start Date End Date Taking? Authorizing Provider  amLODipine (NORVASC) 10 MG tablet Take 1 tablet (10 mg total) by mouth daily. 11/09/21   Zenia Resides, MD  atorvastatin (LIPITOR) 20 MG tablet Take 1 tablet (20 mg total) by mouth daily. 07/18/19   Shade Flood, MD  baclofen (LIORESAL) 10 MG tablet Take 1 tablet (10 mg total) by mouth 3 (three) times daily as needed (hiccups). 11/09/21   Zenia Resides, MD  Blood Pressure Monitoring (BLOOD PRESSURE KIT) DEVI 1 Units by Does not apply route daily. 01/22/17   Benjiman Core D, PA-C  losartan-hydrochlorothiazide (HYZAAR) 100-25 MG tablet Take 1 tablet by mouth daily. 11/09/21   Zenia Resides, MD  metoprolol succinate (TOPROL-XL) 25 MG 24 hr tablet Take 1 tablet (25 mg total) by mouth daily. 11/09/21   Zenia Resides, MD  omeprazole (PRILOSEC) 40 MG capsule Take 1 capsule (40 mg total) by mouth daily. 11/09/21   Zenia Resides, MD  tamsulosin (FLOMAX)  0.4 MG CAPS capsule Take 1 capsule (0.4 mg total) by mouth daily. 11/17/18   Shade Flood, MD      Allergies    Patient has no known allergies.    Review of Systems   Review of Systems  Cardiovascular:  Positive for near-syncope.    Physical Exam Updated Vital Signs Pulse 90   Temp 97.6 F (36.4 C) (Oral)   Resp 17   Ht 5\' 11"  (1.803 m)   Wt 93.4 kg   SpO2 100%   BMI 28.73 kg/m  Physical Exam Vitals and nursing note reviewed.  Constitutional:      Appearance: He is well-developed.  HENT:     Head: Normocephalic and atraumatic.  Eyes:     Pupils: Pupils are equal, round, and reactive to light.  Neck:     Vascular: No JVD.  Cardiovascular:     Rate and Rhythm: Normal rate and regular rhythm.     Heart sounds: No murmur heard.    No friction rub. No gallop.  Pulmonary:     Effort: No respiratory distress.     Breath sounds: No wheezing.  Abdominal:     General: There is no distension.     Tenderness: There is no abdominal tenderness. There is no guarding or rebound.  Musculoskeletal:        General: Normal range  of motion.     Cervical back: Normal range of motion and neck supple.  Skin:    Coloration: Skin is not pale.     Findings: No rash.  Neurological:     Mental Status: He is alert and oriented to person, place, and time.  Psychiatric:        Behavior: Behavior normal.     ED Results / Procedures / Treatments   Labs (all labs ordered are listed, but only abnormal results are displayed) Labs Reviewed  BASIC METABOLIC PANEL - Abnormal; Notable for the following components:      Result Value   Sodium 133 (*)    Potassium 3.4 (*)    Chloride 92 (*)    Creatinine, Ser 1.41 (*)    GFR, Estimated 56 (*)    Anion gap 19 (*)    All other components within normal limits  CBC WITH DIFFERENTIAL/PLATELET  MAGNESIUM    EKG None  Radiology DG Chest Port 1 View  Result Date: 02/24/2023 CLINICAL DATA:  Syncope.  Cold symptoms. EXAM: PORTABLE CHEST 1  VIEW COMPARISON:  10/02/2013. FINDINGS: Bilateral lung fields are clear. Bilateral costophrenic angles are clear. Normal cardio-mediastinal silhouette. No acute osseous abnormalities. The soft tissues are within normal limits. IMPRESSION: No active disease. Electronically Signed   By: Jules Schick M.D.   On: 02/24/2023 13:14    Procedures Procedures    Medications Ordered in ED Medications - No data to display  ED Course/ Medical Decision Making/ A&P                                 Medical Decision Making Amount and/or Complexity of Data Reviewed Labs: ordered. Radiology: ordered. ECG/medicine tests: ordered.   62 yo M with a chief complaints of a syncopal event.  Sounds vasovagal by history.  Only complicating factor is he had a similar event about a month ago.  Will obtain electrolytes give a bolus of IV fluids keep on the monitor here.  Patient has a acute kidney injury on lab work.  Sodium and chloride are low consistent with dehydration as well.  I do have an elevated anion gap that I am not certain of the cause of.  I reexamined the patient and she does feel better.  He denies diarrhea, perhaps due to dehydration.  He is not acidotic.  I discussed recent benefits of admission with the patient.  I did offer admission with an acute kidney injury and no 2 syncopal events in the past month.  He is currently declining and would like to follow-up as an outpatient.  Encouraged him to follow-up with his family doctor in the office.  Recommended repeat blood work roughly within a week if possible.  Push fluids.   2:03 PM:  I have discussed the diagnosis/risks/treatment options with the patient.  Evaluation and diagnostic testing in the emergency department does not suggest an emergent condition requiring admission or immediate intervention beyond what has been performed at this time.  They will follow up with PCP. We also discussed returning to the ED immediately if new or worsening sx  occur. We discussed the sx which are most concerning (e.g., sudden worsening pain, fever, inability to tolerate by mouth) that necessitate immediate return. Medications administered to the patient during their visit and any new prescriptions provided to the patient are listed below.  Medications given during this visit Medications - No data to display  The patient appears reasonably screen and/or stabilized for discharge and I doubt any other medical condition or other [facility] requiring further screening, evaluation, or treatment in the ED at this time prior to discharge.          Final Clinical Impression(s) / ED Diagnoses Final diagnoses:  Syncope and collapse  Dehydration    Rx / DC Orders ED Discharge Orders     None         Melene Plan, DO 02/24/23 1403

## 2023-02-24 NOTE — ED Triage Notes (Signed)
Pt BIB [facility] due to a near-syncope episode at patient's workplace. Positive for orthostatic changes. Patient has had cold symptoms for a few days. Pt was given 200 cc's of fluid by EMS.

## 2023-02-24 NOTE — Discharge Instructions (Signed)
On your blood work your quite dehydrated.  Please eat and drink as well as you can for the next couple days.  Please follow-up with your family doctor in the office.  Hopefully they can recheck your blood work in about a week or so.  You had an elevated anion gap.  Not sure of the exact cause of this.  Your family doctor likely will want to recheck this as well.  Please return for any worsening symptoms especially if you feel you get a pass out again or develop chest pain headache or abdominal pain.  Return if you are not able to eat and drink well.

## 2023-03-15 ENCOUNTER — Telehealth: Payer: Self-pay | Admitting: *Deleted

## 2023-03-15 NOTE — Progress Notes (Signed)
Transition Care Management Unsuccessful Follow-up Telephone Call  Date of discharge and from where:  [facility]  02/24/2023  Attempts:  1st Attempt  Reason for unsuccessful TCM follow-up call:  Left voice message

## 2023-06-22 ENCOUNTER — Other Ambulatory Visit: Payer: Self-pay

## 2023-06-22 ENCOUNTER — Encounter (HOSPITAL_COMMUNITY): Payer: Self-pay | Admitting: *Deleted

## 2023-06-22 ENCOUNTER — Ambulatory Visit (HOSPITAL_COMMUNITY)
Admission: EM | Admit: 2023-06-22 | Discharge: 2023-06-22 | Disposition: A | Discharge status: Home / Self Care | Attending: Emergency Medicine | Admitting: Emergency Medicine

## 2023-06-22 DIAGNOSIS — Z760 Encounter for issue of repeat prescription: Secondary | ICD-10-CM | POA: Diagnosis not present

## 2023-06-22 DIAGNOSIS — I10 Essential (primary) hypertension: Secondary | ICD-10-CM | POA: Diagnosis not present

## 2023-06-22 DIAGNOSIS — R000 Tachycardia, unspecified: Secondary | ICD-10-CM

## 2023-06-22 MED ORDER — AMLODIPINE BESYLATE 10 MG PO TABS: 10.0000 mg | ORAL_TABLET | Freq: Every day | ORAL | 1 refills | Status: DC

## 2023-06-22 MED ORDER — LOSARTAN POTASSIUM-HCTZ 100-25 MG PO TABS: 1.0000 | ORAL_TABLET | Freq: Every day | ORAL | 1 refills | Status: DC

## 2023-06-22 MED ORDER — METOPROLOL SUCCINATE ER 25 MG PO TB24: 25.0000 mg | ORAL_TABLET | Freq: Every day | ORAL | 1 refills | Status: DC

## 2023-06-22 NOTE — ED Provider Notes (Signed)
 [facility]    CSN: [id number] Arrival date & time: 06/22/23  0850      History   Chief Complaint Chief Complaint  Patient presents with   Medication Refill    HPI Jeffrey Harrington is a 63 y.o. male.   Patient presents requesting refills of amlodipine, losartan-hydrochlorothiazide, and metoprolol.  Patient states he has been unable to get an appointment with his primary regarding further management of his blood pressure.  Patient states he has not been taking his medication every day in order to save it for a longer period of time, but has not run out of his medication.  Denies headache, dizziness, blurred vision, chest pain, weakness, numbness, slurred speech, and confusion.   Medication Refill   Past Medical History:  Diagnosis Date   Allergy    Arthritis    Hyperlipidemia    Hypertension    Personal history of colonic adenomas 06/07/2012   05/2012 - 2 adenomas max 10 mm   Prostate cancer (HCC) 11/09/2016   Syncope 2008    Patient Active Problem List   Diagnosis Date Noted   Malignant neoplasm of prostate (HCC) 12/21/2016   HTN (hypertension) 10/02/2013   History of colonic polyps 06/07/2012    Past Surgical History:  Procedure Laterality Date   COLONOSCOPY  2014, 2017   polyps   HEMORRHOID BANDING  2014   HEMORRHOID SURGERY     INGUINAL HERNIA REPAIR Right    PROSTATE BIOPSY         Home Medications    Prior to Admission medications   Medication Sig Start Date End Date Taking? Authorizing Provider  amLODipine (NORVASC) 10 MG tablet Take 1 tablet (10 mg total) by mouth daily. 06/22/23   Wynonia Lawman A, NP  atorvastatin (LIPITOR) 20 MG tablet Take 1 tablet (20 mg total) by mouth daily. 07/18/19   Shade Flood, MD  baclofen (LIORESAL) 10 MG tablet Take 1 tablet (10 mg total) by mouth 3 (three) times daily as needed (hiccups). 11/09/21   Zenia Resides, MD  Blood Pressure Monitoring (BLOOD PRESSURE KIT) DEVI 1 Units by Does not apply  route daily. 01/22/17   Benjiman Core D, PA-C  losartan-hydrochlorothiazide (HYZAAR) 100-25 MG tablet Take 1 tablet by mouth daily. 06/22/23   Wynonia Lawman A, NP  metoprolol succinate (TOPROL-XL) 25 MG 24 hr tablet Take 1 tablet (25 mg total) by mouth daily. 06/22/23   Wynonia Lawman A, NP  omeprazole (PRILOSEC) 40 MG capsule Take 1 capsule (40 mg total) by mouth daily. 11/09/21   Zenia Resides, MD  tamsulosin (FLOMAX) 0.4 MG CAPS capsule Take 1 capsule (0.4 mg total) by mouth daily. 11/17/18   Shade Flood, MD    Family History Family History  Problem Relation Age of Onset   Diabetes Mother    Hypertension Mother    Lung cancer Father    Throat cancer Brother        throat/found in lymph nodes   Colon cancer Neg Hx    Colon polyps Neg Hx    Esophageal cancer Neg Hx    Rectal cancer Neg Hx    Stomach cancer Neg Hx     Social History Social History   Tobacco Use   Smoking status: Every Day    Current packs/day: 0.50    Average packs/day: 0.5 packs/day for 39.0 years (19.5 ttl pk-yrs)    Types: Cigarettes   Smokeless tobacco: Never  Vaping Use   Vaping status: Never  Used  Substance Use Topics   Alcohol use: Yes    Alcohol/week: 3.0 standard drinks of alcohol    Types: 3 Standard drinks or equivalent per week    Comment: 2 per day   Drug use: No     Allergies   Patient has no known allergies.   Review of Systems Review of Systems  Per HPI  Physical Exam Triage Vital Signs ED Triage Vitals  Encounter Vitals Group     BP 06/22/23 0907 (!) 160/90     Systolic BP Percentile --      Diastolic BP Percentile --      Pulse Rate 06/22/23 0907 73     Resp 06/22/23 0907 20     Temp 06/22/23 0907 98.3 F (36.8 C)     Temp src --      SpO2 06/22/23 0907 98 %     Weight --      Height --      Head Circumference --      Peak Flow --      Pain Score 06/22/23 0905 0     Pain Loc --      Pain Education --      Exclude from Growth Chart --    No data  found.  Updated Vital Signs BP (!) 160/90   Pulse 73   Temp 98.3 F (36.8 C)   Resp 20   SpO2 98%   Visual Acuity Right Eye Distance:   Left Eye Distance:   Bilateral Distance:    Right Eye Near:   Left Eye Near:    Bilateral Near:     Physical Exam Vitals and nursing note reviewed.  Constitutional:      General: He is awake. He is not in acute distress.    Appearance: Normal appearance. He is well-developed and well-groomed. He is not ill-appearing.  Neurological:     General: No focal deficit present.     Mental Status: He is alert and oriented to person, place, and time. Mental status is at baseline.     GCS: GCS eye subscore is 4. GCS verbal subscore is 5. GCS motor subscore is 6.     Cranial Nerves: Cranial nerves 2-12 are intact.     Sensory: Sensation is intact.     Motor: Motor function is intact.     Coordination: Coordination is intact.     Gait: Gait is intact.  Psychiatric:        Behavior: Behavior is cooperative.      UC Treatments / Results  Labs (all labs ordered are listed, but only abnormal results are displayed) Labs Reviewed - No data to display  EKG   Radiology No results found.  Procedures Procedures (including critical care time)  Medications Ordered in UC Medications - No data to display  Initial Impression / Assessment and Plan / UC Course  I have reviewed the triage vital signs and the nursing notes.  Pertinent labs & imaging results that were available during my care of the patient were reviewed by me and considered in my medical decision making (see chart for details).     No significant findings upon exam.  No neurodeficits noted.  GCS 15.  Refilled blood pressure medications and discussed importance of following up with primary care provider.  Given information for the [facility] and mobile health clinic schedule if patient is unable to obtain an appointment with primary care provider.  Discussed return precautions. Final Clinical Impressions(s) / UC Diagnoses   Final diagnoses:  Encounter for medication refill  Essential hypertension     Discharge Instructions      I have refilled your amlodipine, losartan-hydrochlorothiazide, and metoprolol with 1 additional refill.  I have attached the information for your primary care Dr. Meredith Staggers that you can call to schedule appointment with regarding further management of your blood pressure.  I have also attached the [facility] in case you are unable to get in with Dr. Meredith Staggers.  I have also attached the mobile health clinic schedule that you can follow-up with where they can also provide further management of your blood pressure.  Return here as needed.   ED Prescriptions     Medication Sig Dispense Auth. Provider   amLODipine (NORVASC) 10 MG tablet Take 1 tablet (10 mg total) by mouth daily. 30 tablet Susann Givens, Granvel Proudfoot A, NP   losartan-hydrochlorothiazide (HYZAAR) 100-25 MG tablet Take 1 tablet by mouth daily. 30 tablet Wynonia Lawman A, NP   metoprolol succinate (TOPROL-XL) 25 MG 24 hr tablet Take 1 tablet (25 mg total) by mouth daily. 30 tablet Wynonia Lawman A, NP      PDMP not reviewed this encounter.   Wynonia Lawman A, NP 06/22/23 1031

## 2023-06-22 NOTE — Discharge Instructions (Addendum)
 I have refilled your amlodipine, losartan-hydrochlorothiazide, and metoprolol with 1 additional refill.  I have attached the information for your primary care Dr. Meredith Staggers that you can call to schedule appointment with regarding further management of your blood pressure.  I have also attached the [facility] in case you are unable to get in with Dr. Meredith Staggers.  I have also attached the mobile health clinic schedule that you can follow-up with where they can also provide further management of your blood pressure.  Return here as needed.

## 2023-06-22 NOTE — ED Triage Notes (Signed)
 PT needs refill on 3 BP meds

## 2023-08-09 ENCOUNTER — Emergency Department (HOSPITAL_COMMUNITY): Admission: EM | Admit: 2023-08-09 | Discharge: 2023-08-09 | Disposition: A | Discharge status: Home / Self Care

## 2023-08-09 ENCOUNTER — Encounter (HOSPITAL_COMMUNITY): Payer: Self-pay | Admitting: Emergency Medicine

## 2023-08-09 ENCOUNTER — Other Ambulatory Visit: Payer: Self-pay

## 2023-08-09 DIAGNOSIS — K219 Gastro-esophageal reflux disease without esophagitis: Secondary | ICD-10-CM | POA: Diagnosis present

## 2023-08-09 LAB — CBC
HCT: 43.8 % (ref 39.0–52.0)
Hemoglobin: 14.5 g/dL (ref 13.0–17.0)
MCH: 27.5 pg (ref 26.0–34.0)
MCHC: 33.1 g/dL (ref 30.0–36.0)
MCV: 83.1 fL (ref 80.0–100.0)
Platelets: 284 10*3/uL (ref 150–400)
RBC: 5.27 MIL/uL (ref 4.22–5.81)
RDW: 14.7 % (ref 11.5–15.5)
WBC: 5.5 10*3/uL (ref 4.0–10.5)
nRBC: 0 % (ref 0.0–0.2)

## 2023-08-09 LAB — COMPREHENSIVE METABOLIC PANEL WITH GFR
ALT: 26 U/L (ref 0–44)
AST: 44 U/L — ABNORMAL HIGH (ref 15–41)
Albumin: 4.6 g/dL (ref 3.5–5.0)
Alkaline Phosphatase: 53 U/L (ref 38–126)
Anion gap: 15 (ref 5–15)
BUN: 10 mg/dL (ref 8–23)
CO2: 24 mmol/L (ref 22–32)
Calcium: 9.6 mg/dL (ref 8.9–10.3)
Chloride: 97 mmol/L — ABNORMAL LOW (ref 98–111)
Creatinine, Ser: 0.73 mg/dL (ref 0.61–1.24)
GFR, Estimated: >60 mL/min (ref >60)
Glucose, Bld: 94 mg/dL (ref 70–99)
Potassium: 3.3 mmol/L — ABNORMAL LOW (ref 3.5–5.1)
Sodium: 136 mmol/L (ref 135–145)
Total Bilirubin: 0.6 mg/dL (ref 0.0–1.2)
Total Protein: 8.9 g/dL — ABNORMAL HIGH (ref 6.5–8.1)

## 2023-08-09 LAB — LIPASE, BLOOD: Lipase: 30 U/L (ref 11–51)

## 2023-08-09 MED ORDER — PANTOPRAZOLE SODIUM 20 MG PO TBEC: 20.0000 mg | DELAYED_RELEASE_TABLET | Freq: Every day | ORAL | 0 refills | Status: DC

## 2023-08-09 NOTE — ED Triage Notes (Signed)
 Patient c/o acid reflux x 1 week.. Patient report worsening hiccups when trying to eat solid foods. Patient report vomiting after ingesting food.

## 2023-08-09 NOTE — Discharge Instructions (Signed)
 Please follow-up with your primary doctor.  Return to emergency department medially for fevers, chills, chest pain, shortness of breath, inability to eat or drink due to nausea vomiting or any new or worsening symptoms that are concerning to you.

## 2023-08-09 NOTE — ED Provider Notes (Signed)
 [place] EMERGENCY DEPARTMENT AT [facility] Provider Note   CSN: [id number] Arrival date & time: 08/09/23  2021     History  Chief Complaint  Patient presents with   Gastroesophageal Reflux    Jeffrey Harrington is a 63 y.o. male.  63 year old male presenting emergency department with GERD and reflux.  Reports having hiccups intermittently since 2023.  Has had worsening GERD and cough for the past week or 2.  Lay down reflux made him vomit x 2.  He is not having chest pain, no shortness of breath.  No abdominal pain.   Gastroesophageal Reflux       Home Medications Prior to Admission medications   Medication Sig Start Date End Date Taking? Authorizing Provider  amLODipine  (NORVASC ) 10 MG tablet Take 1 tablet (10 mg total) by mouth daily. 06/22/23   Levora Reas A, NP  atorvastatin  (LIPITOR) 20 MG tablet Take 1 tablet (20 mg total) by mouth daily. 07/18/19   Benjiman Bras, MD  baclofen  (LIORESAL ) 10 MG tablet Take 1 tablet (10 mg total) by mouth 3 (three) times daily as needed (hiccups). 11/09/21   Ann Keto, MD  Blood Pressure Monitoring (BLOOD PRESSURE KIT) DEVI 1 Units by Does not apply route daily. 01/22/17   Wiseman, Brittany D, PA-C  losartan -hydrochlorothiazide  (HYZAAR) 100-25 MG tablet Take 1 tablet by mouth daily. 06/22/23   Levora Reas A, NP  metoprolol  succinate (TOPROL -XL) 25 MG 24 hr tablet Take 1 tablet (25 mg total) by mouth daily. 06/22/23   Levora Reas A, NP  omeprazole  (PRILOSEC) 40 MG capsule Take 1 capsule (40 mg total) by mouth daily. 11/09/21   Ann Keto, MD  tamsulosin  (FLOMAX ) 0.4 MG CAPS capsule Take 1 capsule (0.4 mg total) by mouth daily. 11/17/18   Benjiman Bras, MD      Allergies    Patient has no known allergies.    Review of Systems   Review of Systems  Physical Exam Updated Vital Signs BP (!) 124/43   Pulse 88   Temp 98 F (36.7 C)   Resp 16   Ht 5\' 11"  (1.803 m)   Wt 93.4 kg   SpO2 100%   BMI  28.72 kg/m  Physical Exam Vitals and nursing note reviewed.  Constitutional:      General: He is not in acute distress.    Appearance: He is not toxic-appearing.  HENT:     Head: Normocephalic.     Nose: Nose normal.     Mouth/Throat:     Mouth: Mucous membranes are moist.  Eyes:     Conjunctiva/sclera: Conjunctivae normal.  Cardiovascular:     Rate and Rhythm: Normal rate and regular rhythm.  Pulmonary:     Effort: Pulmonary effort is normal.     Breath sounds: Normal breath sounds.  Abdominal:     General: Abdomen is flat. There is no distension.     Tenderness: There is no abdominal tenderness. There is no guarding or rebound.  Musculoskeletal:        General: Normal range of motion.  Skin:    General: Skin is warm and dry.     Capillary Refill: Capillary refill takes less than 2 seconds.  Neurological:     Mental Status: He is alert and oriented to person, place, and time.  Psychiatric:        Mood and Affect: Mood normal.        Behavior: Behavior normal.  ED Results / Procedures / Treatments   Labs (all labs ordered are listed, but only abnormal results are displayed) Labs Reviewed  COMPREHENSIVE METABOLIC PANEL WITH GFR - Abnormal; Notable for the following components:      Result Value   Potassium 3.3 (*)    Chloride 97 (*)    Total Protein 8.9 (*)    AST 44 (*)    All other components within normal limits  LIPASE, BLOOD  CBC    EKG None  Radiology No results found.  Procedures Procedures    Medications Ordered in ED Medications - No data to display  ED Course/ Medical Decision Making/ A&P                                 Medical Decision Making Well-appearing 63 year old male presenting emergency department with reflux.  He is afebrile vital signs reassuring.  Physical exam does not appear to be in overt distress.  Lungs are clear.  Benign abdominal exam.  Mildly low potassium.  Minor elevation in AST, no right upper quadrant tenderness.   Lipase normal.  Pancreatitis unlikely.  No leukocytosis to suggest infectious process.  No anemia.  He has never seen GI or had an EGD.  Will prescribe Protonix and have patient follow-up with primary doctor and GI. Stable for discharge.   Amount and/or Complexity of Data Reviewed Labs: ordered.         Final Clinical Impression(s) / ED Diagnoses Final diagnoses:  None    Rx / DC Orders ED Discharge Orders     None         Rolinda Climes, DO 08/09/23 2259

## 2023-09-12 ENCOUNTER — Other Ambulatory Visit: Payer: Self-pay

## 2023-09-12 ENCOUNTER — Ambulatory Visit
Admission: EM | Admit: 2023-09-12 | Discharge: 2023-09-12 | Disposition: A | Discharge status: Home / Self Care | Attending: Family Medicine | Admitting: Family Medicine

## 2023-09-12 DIAGNOSIS — J029 Acute pharyngitis, unspecified: Secondary | ICD-10-CM | POA: Diagnosis not present

## 2023-09-12 DIAGNOSIS — K219 Gastro-esophageal reflux disease without esophagitis: Secondary | ICD-10-CM | POA: Diagnosis not present

## 2023-09-12 DIAGNOSIS — J209 Acute bronchitis, unspecified: Secondary | ICD-10-CM | POA: Diagnosis not present

## 2023-09-12 MED ORDER — AZITHROMYCIN 250 MG PO TABS: 250.0000 mg | ORAL_TABLET | Freq: Every day | ORAL | 0 refills | Status: AC

## 2023-09-12 MED ORDER — BENZONATATE 200 MG PO CAPS: 200.0000 mg | ORAL_CAPSULE | Freq: Three times a day (TID) | ORAL | 0 refills | Status: AC | PRN

## 2023-09-12 MED ORDER — PANTOPRAZOLE SODIUM 20 MG PO TBEC: 20.0000 mg | DELAYED_RELEASE_TABLET | Freq: Every day | ORAL | 1 refills | Status: AC

## 2023-09-12 MED ORDER — LIDOCAINE VISCOUS HCL 2 % MT SOLN: 15.0000 mL | Freq: Four times a day (QID) | OROMUCOSAL | 0 refills | Status: AC | PRN

## 2023-09-12 NOTE — ED Provider Notes (Signed)
 [facility]    CSN: [id number] Arrival date & time: 09/12/23  0806      History   Chief Complaint No chief complaint on file.   HPI Jeffrey Harrington is a 64 y.o. male  presents for evaluation of URI symptoms for 2 weeks. Patient reports associated symptoms of productive cough with intermittent sore throat that worsened last night, and posttussive vomiting. Denies diarrhea, fevers, shortness of breath, ear pain, body aches. Patient does not have a hx of asthma. Patient is an active smoker.  Denies history of COPD.  Reports no sick contacts.  Pt has taken cough drops OTC for symptoms.  States he has not taken his blood pressure medications this morning due to his sore throat.  He also has been out of his pantoprazole  since he was last seen in the ER last month.  States he does not currently have a PCP.  Pt has no other concerns at this time.   HPI  Past Medical History:  Diagnosis Date   Allergy    Arthritis    Hyperlipidemia    Hypertension    Personal history of colonic adenomas 06/07/2012   05/2012 - 2 adenomas max 10 mm   Prostate cancer (HCC) 11/09/2016   Syncope 2008    Patient Active Problem List   Diagnosis Date Noted   Malignant neoplasm of prostate (HCC) 12/21/2016   HTN (hypertension) 10/02/2013   History of colonic polyps 06/07/2012    Past Surgical History:  Procedure Laterality Date   COLONOSCOPY  2014, 2017   polyps   HEMORRHOID BANDING  2014   HEMORRHOID SURGERY     INGUINAL HERNIA REPAIR Right    PROSTATE BIOPSY         Home Medications    Prior to Admission medications   Medication Sig Start Date End Date Taking? Authorizing Provider  azithromycin (ZITHROMAX) 250 MG tablet Take 1 tablet (250 mg total) by mouth daily. Take first 2 tablets together, then 1 every day until finished. 09/12/23  Yes Lizzete Gough, Jodi R, NP  benzonatate (TESSALON) 200 MG capsule Take 1 capsule (200 mg total) by mouth 3 (three) times daily as needed. 09/12/23  Yes  Amaar Oshita, Jodi R, NP  lidocaine (XYLOCAINE) 2 % solution Use as directed 15 mLs in the mouth or throat every 6 (six) hours as needed (sore throat). Gargle and spit, do not swallow 09/12/23  Yes Brieonna Crutcher, Jodi R, NP  amLODipine  (NORVASC ) 10 MG tablet Take 1 tablet (10 mg total) by mouth daily. 06/22/23   Johnie Flaming A, NP  atorvastatin  (LIPITOR) 20 MG tablet Take 1 tablet (20 mg total) by mouth daily. 07/18/19   Levora Reyes SAUNDERS, MD  baclofen  (LIORESAL ) 10 MG tablet Take 1 tablet (10 mg total) by mouth 3 (three) times daily as needed (hiccups). 11/09/21   Vonna Sharlet POUR, MD  Blood Pressure Monitoring (BLOOD PRESSURE KIT) DEVI 1 Units by Does not apply route daily. 01/22/17   Wiseman, Brittany D, PA-C  losartan -hydrochlorothiazide  (HYZAAR) 100-25 MG tablet Take 1 tablet by mouth daily. 06/22/23   Johnie Flaming A, NP  metoprolol  succinate (TOPROL -XL) 25 MG 24 hr tablet Take 1 tablet (25 mg total) by mouth daily. 06/22/23   Johnie Flaming A, NP  pantoprazole  (PROTONIX ) 20 MG tablet Take 1 tablet (20 mg total) by mouth daily. 09/12/23 10/12/23  Lessa Huge, Jodi R, NP  tamsulosin  (FLOMAX ) 0.4 MG CAPS capsule Take 1 capsule (0.4 mg total) by mouth daily. 11/17/18   Levora Reyes  R, MD    Family History Family History  Problem Relation Age of Onset   Diabetes Mother    Hypertension Mother    Lung cancer Father    Throat cancer Brother        throat/found in lymph nodes   Colon cancer Neg Hx    Colon polyps Neg Hx    Esophageal cancer Neg Hx    Rectal cancer Neg Hx    Stomach cancer Neg Hx     Social History Social History   Tobacco Use   Smoking status: Every Day    Current packs/day: 0.50    Average packs/day: 0.5 packs/day for 39.0 years (19.5 ttl pk-yrs)    Types: Cigarettes   Smokeless tobacco: Never  Vaping Use   Vaping status: Never Used  Substance Use Topics   Alcohol use: Yes    Alcohol/week: 3.0 standard drinks of alcohol    Types: 3 Standard drinks or equivalent per week     Comment: 2 per day   Drug use: No     Allergies   Patient has no known allergies.   Review of Systems Review of Systems  HENT:  Positive for congestion and sore throat.   Respiratory:  Positive for cough.      Physical Exam Triage Vital Signs ED Triage Vitals  Encounter Vitals Group     BP 09/12/23 0822 (!) 170/102     Girls Systolic BP Percentile --      Girls Diastolic BP Percentile --      Boys Systolic BP Percentile --      Boys Diastolic BP Percentile --      Pulse Rate 09/12/23 0822 (!) 109     Resp 09/12/23 0822 17     Temp 09/12/23 0822 97.8 F (36.6 C)     Temp Source 09/12/23 0822 Oral     SpO2 09/12/23 0822 98 %     Weight --      Height --      Head Circumference --      Peak Flow --      Pain Score 09/12/23 0820 9     Pain Loc --      Pain Education --      Exclude from Growth Chart --    No data found.  Updated Vital Signs BP (!) 170/102   Pulse (!) 109   Temp 97.8 F (36.6 C) (Oral)   Resp 17   SpO2 98%   Visual Acuity Right Eye Distance:   Left Eye Distance:   Bilateral Distance:    Right Eye Near:   Left Eye Near:    Bilateral Near:     Physical Exam Vitals and nursing note reviewed.  Constitutional:      General: He is not in acute distress.    Appearance: Normal appearance. He is not ill-appearing or toxic-appearing.  HENT:     Head: Normocephalic and atraumatic.     Right Ear: Tympanic membrane and ear canal normal.     Left Ear: Tympanic membrane and ear canal normal.     Nose: Congestion present.     Mouth/Throat:     Mouth: Mucous membranes are moist.     Pharynx: Posterior oropharyngeal erythema present.   Eyes:     Pupils: Pupils are equal, round, and reactive to light.    Cardiovascular:     Rate and Rhythm: Regular rhythm. Tachycardia present.     Heart sounds: Normal heart sounds.  Comments: History of tachycardia, heart rate 109.  Patient has not taken his metoprolol  today. Pulmonary:     Effort:  Pulmonary effort is normal.     Breath sounds: Normal breath sounds. No wheezing or rhonchi.   Musculoskeletal:     Cervical back: Normal range of motion and neck supple.  Lymphadenopathy:     Cervical: No cervical adenopathy.   Skin:    General: Skin is warm and dry.   Neurological:     General: No focal deficit present.     Mental Status: He is alert and oriented to person, place, and time.   Psychiatric:        Mood and Affect: Mood normal.        Behavior: Behavior normal.      UC Treatments / Results  Labs (all labs ordered are listed, but only abnormal results are displayed) Labs Reviewed - No data to display  EKG   Radiology No results found.  Procedures Procedures (including critical care time)  Medications Ordered in UC Medications - No data to display  Initial Impression / Assessment and Plan / UC Course  I have reviewed the triage vital signs and the nursing notes.  Pertinent labs & imaging results that were available during my care of the patient were reviewed by me and considered in my medical decision making (see chart for details).     Reviewed exam and symptoms with patient.  He is presenting with 2 weeks of cough with intermittent sore throat.  He is afebrile in clinic.  Will start Zithromycin, Tessalon, lidocaine gargle.  I did refill his Protonix  as his uncontrolled GERD can be contributing to his intermittent sore throat.  Nursing staff was able to establish patient with a PCP for further workup/chronic disease management.  He has not taken his blood pressure medications today.  No chest pain, shortness of breath, headache, dizziness, visual changes.  Heart rate slightly elevated, patient has a history of tachycardia and is on metoprolol . Advised rest fluids and strict ER precautions were reviewed. Final Clinical Impressions(s) / UC Diagnoses   Final diagnoses:  Acute bronchitis, unspecified organism  Acute pharyngitis, unspecified etiology   Gastroesophageal reflux disease, unspecified whether esophagitis present     Discharge Instructions      Start Zithromycin as prescribed.  May take Tessalon 3 times a day as needed for your cough.  Lidocaine gargle as needed.  Please gargle and spit and do not swallow this medication.  I refilled your pantoprazole  for your acid reflux.  Lots of rest and fluids.  Please follow-up with your PCP at your scheduled appointment.  Please go to ER if you develop any worsening symptoms.  Hope you feel better soon!    ED Prescriptions     Medication Sig Dispense Auth. Provider   benzonatate (TESSALON) 200 MG capsule Take 1 capsule (200 mg total) by mouth 3 (three) times daily as needed. 20 capsule Yariah Selvey, Jodi R, NP   lidocaine (XYLOCAINE) 2 % solution Use as directed 15 mLs in the mouth or throat every 6 (six) hours as needed (sore throat). Gargle and spit, do not swallow 100 mL Niobe Dick, Jodi R, NP   azithromycin (ZITHROMAX) 250 MG tablet Take 1 tablet (250 mg total) by mouth daily. Take first 2 tablets together, then 1 every day until finished. 6 tablet Cortlandt Capuano, Jodi R, NP   pantoprazole  (PROTONIX ) 20 MG tablet Take 1 tablet (20 mg total) by mouth daily. 30 tablet Toya Palacios, Jodi R, NP  PDMP not reviewed this encounter.   Loreda Myla SAUNDERS, NP 09/12/23 [phone]

## 2023-09-12 NOTE — ED Triage Notes (Signed)
 Pt c/o sore throat, dysphagia, N/Vx2wks

## 2023-09-12 NOTE — Discharge Instructions (Signed)
 Start Zithromycin as prescribed.  May take Tessalon 3 times a day as needed for your cough.  Lidocaine gargle as needed.  Please gargle and spit and do not swallow this medication.  I refilled your pantoprazole  for your acid reflux.  Lots of rest and fluids.  Please follow-up with your PCP at your scheduled appointment.  Please go to ER if you develop any worsening symptoms.  Hope you feel better soon!

## 2023-10-11 ENCOUNTER — Encounter (HOSPITAL_COMMUNITY): Payer: Self-pay

## 2023-10-11 ENCOUNTER — Ambulatory Visit (HOSPITAL_COMMUNITY)
Admission: EM | Admit: 2023-10-11 | Discharge: 2023-10-11 | Disposition: A | Discharge status: Home / Self Care | Attending: Internal Medicine | Admitting: Internal Medicine

## 2023-10-11 ENCOUNTER — Ambulatory Visit (INDEPENDENT_AMBULATORY_CARE_PROVIDER_SITE_OTHER)

## 2023-10-11 DIAGNOSIS — Z760 Encounter for issue of repeat prescription: Secondary | ICD-10-CM

## 2023-10-11 DIAGNOSIS — R051 Acute cough: Secondary | ICD-10-CM

## 2023-10-11 DIAGNOSIS — I10 Essential (primary) hypertension: Secondary | ICD-10-CM

## 2023-10-11 DIAGNOSIS — R000 Tachycardia, unspecified: Secondary | ICD-10-CM

## 2023-10-11 MED ORDER — AMLODIPINE BESYLATE 10 MG PO TABS: 10.0000 mg | ORAL_TABLET | Freq: Every day | ORAL | 0 refills | Status: AC

## 2023-10-11 MED ORDER — PREDNISONE 20 MG PO TABS: 40.0000 mg | ORAL_TABLET | Freq: Every day | ORAL | 0 refills | Status: AC

## 2023-10-11 MED ORDER — LOSARTAN POTASSIUM-HCTZ 100-25 MG PO TABS: 1.0000 | ORAL_TABLET | Freq: Every day | ORAL | 0 refills | Status: AC

## 2023-10-11 MED ORDER — METOPROLOL SUCCINATE ER 25 MG PO TB24: 25.0000 mg | ORAL_TABLET | Freq: Every day | ORAL | 0 refills | Status: AC

## 2023-10-11 NOTE — ED Triage Notes (Signed)
 Patient reports that he had bronchitis and received medication for the same on 09/12/23. Patient states he feels a lot better, abut still has a sore throat and a productive cough with dark yellow sputum.  Patient is also requesting a medication refill for amlodipine , losartan -hydrochlorothiazide , and Metoprolol 

## 2023-10-11 NOTE — Discharge Instructions (Addendum)
 Chest x-ray done today.  Final evaluation by the radiologist still pending but on brief evaluation there does not appear to be any acute or infectious process.  Symptoms most likely residual cough from recently upper respiratory illness but not secondary to an infectious process. This does not require antibiotics.  Your vital signs and physical exam findings are reassuring.  We will attempt to improve the symptoms with the following:  Prednisone  40 mg (2 tablets) once daily for 5 days. Take this in the morning.  This is a steroid to help with inflammation and pain. May continue to use Benzonatate  for cough  Make sure to stay hydrated and drink plenty of fluids.  Return to urgent care or PCP if symptoms worsen or fail to resolve.    For your hypertension medications, we will refill these with enough to get you to your primary care appointment that is scheduled for 7/30 at 8:20. Please do not miss this appointment. It is important for you to establish care with them for management of your chronic medical issues. We have refilled the following medications:  Amlodipine  10 mg daily.  Losartan /hydrochlorothiazide  100/25 mg daily. Metoprolol  XL 25 mg daily.

## 2023-10-11 NOTE — ED Provider Notes (Signed)
 [facility]    CSN: [id number] Arrival date & time: 10/11/23  1148      History   Chief Complaint Chief Complaint  Patient presents with   Sore Throat   Cough   Medication Refill    HPI Jeffrey Harrington is a 63 y.o. male.   63 y.o. male who presents to urgent care with complaints of persistent cough for about a month now.  He was treated near the end of June for acute bronchitis.  He reports the majority of his symptoms resolved with this and he has been feeling much better but he has had a persistent cough.  This does cause him to have a sore throat.  He denies any fevers, chills, chest pain, nausea, vomiting.  He does get a little short of breath with heavy activity but otherwise no shortness of breath at rest.  He has been using a cough medication that was prescribed.  He also relates that he needs refills on 3 of his blood pressure medications.  He has an appointment with primary care but does not have enough to make it to that day.  0.73 Cr. 07/2023   Sore Throat Pertinent negatives include no chest pain, no abdominal pain and no shortness of breath.  Cough Associated symptoms: sore throat (from coughing)   Associated symptoms: no chest pain, no chills, no ear pain, no fever, no rash and no shortness of breath   Medication Refill   Past Medical History:  Diagnosis Date   Allergy    Arthritis    Hyperlipidemia    Hypertension    Personal history of colonic adenomas 06/07/2012   05/2012 - 2 adenomas max 10 mm   Prostate cancer (HCC) 11/09/2016   Syncope 2008    Patient Active Problem List   Diagnosis Date Noted   Malignant neoplasm of prostate (HCC) 12/21/2016   HTN (hypertension) 10/02/2013   History of colonic polyps 06/07/2012    Past Surgical History:  Procedure Laterality Date   COLONOSCOPY  2014, 2017   polyps   HEMORRHOID BANDING  2014   HEMORRHOID SURGERY     INGUINAL HERNIA REPAIR Right    PROSTATE BIOPSY         Home Medications     Prior to Admission medications   Medication Sig Start Date End Date Taking? Authorizing Provider  amLODipine  (NORVASC ) 10 MG tablet Take 1 tablet (10 mg total) by mouth daily. 06/22/23   Johnie Flaming A, NP  atorvastatin  (LIPITOR) 20 MG tablet Take 1 tablet (20 mg total) by mouth daily. 07/18/19   Levora Reyes SAUNDERS, MD  azithromycin  (ZITHROMAX ) 250 MG tablet Take 1 tablet (250 mg total) by mouth daily. Take first 2 tablets together, then 1 every day until finished. 09/12/23   Mayer, Jodi R, NP  baclofen  (LIORESAL ) 10 MG tablet Take 1 tablet (10 mg total) by mouth 3 (three) times daily as needed (hiccups). 11/09/21   Vonna Sharlet POUR, MD  benzonatate  (TESSALON ) 200 MG capsule Take 1 capsule (200 mg total) by mouth 3 (three) times daily as needed. 09/12/23   Loreda Myla SAUNDERS, NP  Blood Pressure Monitoring (BLOOD PRESSURE KIT) DEVI 1 Units by Does not apply route daily. 01/22/17   Starla Grate D, PA-C  lidocaine  (XYLOCAINE ) 2 % solution Use as directed 15 mLs in the mouth or throat every 6 (six) hours as needed (sore throat). Gargle and spit, do not swallow 09/12/23   Mayer, Jodi R, NP  losartan -hydrochlorothiazide  [facility])  100-25 MG tablet Take 1 tablet by mouth daily. 06/22/23   Johnie Flaming A, NP  metoprolol  succinate (TOPROL -XL) 25 MG 24 hr tablet Take 1 tablet (25 mg total) by mouth daily. 06/22/23   Johnie Flaming A, NP  pantoprazole  (PROTONIX ) 20 MG tablet Take 1 tablet (20 mg total) by mouth daily. Patient not taking: Reported on 10/11/2023 09/12/23 10/12/23  Mayer, Jodi R, NP  tamsulosin  (FLOMAX ) 0.4 MG CAPS capsule Take 1 capsule (0.4 mg total) by mouth daily. Patient not taking: Reported on 10/11/2023 11/17/18   Levora Reyes SAUNDERS, MD    Family History Family History  Problem Relation Age of Onset   Diabetes Mother    Hypertension Mother    Lung cancer Father    Throat cancer Brother        throat/found in lymph nodes   Colon cancer Neg Hx    Colon polyps Neg Hx    Esophageal cancer  Neg Hx    Rectal cancer Neg Hx    Stomach cancer Neg Hx     Social History Social History   Tobacco Use   Smoking status: Every Day    Current packs/day: 0.50    Average packs/day: 0.5 packs/day for 39.0 years (19.5 ttl pk-yrs)    Types: Cigarettes   Smokeless tobacco: Never  Vaping Use   Vaping status: Never Used  Substance Use Topics   Alcohol use: Yes    Alcohol/week: 3.0 standard drinks of alcohol    Types: 3 Standard drinks or equivalent per week    Comment: 2 per day   Drug use: No     Allergies   Patient has no known allergies.   Review of Systems Review of Systems  Constitutional:  Negative for chills and fever.  HENT:  Positive for sore throat (from coughing). Negative for ear pain.   Eyes:  Negative for pain and visual disturbance.  Respiratory:  Positive for cough. Negative for shortness of breath.   Cardiovascular:  Negative for chest pain and palpitations.  Gastrointestinal:  Negative for abdominal pain and vomiting.  Genitourinary:  Negative for dysuria and hematuria.  Musculoskeletal:  Negative for arthralgias and back pain.  Skin:  Negative for color change and rash.  Neurological:  Negative for seizures and syncope.  All other systems reviewed and are negative.    Physical Exam Triage Vital Signs ED Triage Vitals [10/11/23 1208]  Encounter Vitals Group     BP (!) 157/95     Girls Systolic BP Percentile      Girls Diastolic BP Percentile      Boys Systolic BP Percentile      Boys Diastolic BP Percentile      Pulse Rate 87     Resp 16     Temp 98.4 F (36.9 C)     Temp Source Oral     SpO2 98 %     Weight      Height      Head Circumference      Peak Flow      Pain Score 7     Pain Loc      Pain Education      Exclude from Growth Chart    No data found.  Updated Vital Signs BP (!) 157/95 (BP Location: Left Arm)   Pulse 87   Temp 98.4 F (36.9 C) (Oral)   Resp 16   SpO2 98%   Visual Acuity Right Eye Distance:   Left Eye  Distance:  Bilateral Distance:    Right Eye Near:   Left Eye Near:    Bilateral Near:     Physical Exam Vitals and nursing note reviewed.  Constitutional:      General: He is not in acute distress.    Appearance: He is well-developed.  HENT:     Head: Normocephalic and atraumatic.     Right Ear: Tympanic membrane normal.     Left Ear: Tympanic membrane normal.     Nose: Congestion present.     Mouth/Throat:     Mouth: Mucous membranes are moist.     Pharynx: No pharyngeal swelling, oropharyngeal exudate or posterior oropharyngeal erythema.  Eyes:     Conjunctiva/sclera: Conjunctivae normal.  Cardiovascular:     Rate and Rhythm: Normal rate and regular rhythm.     Heart sounds: No murmur heard. Pulmonary:     Effort: Pulmonary effort is normal. No respiratory distress.     Breath sounds: Examination of the right-upper field reveals decreased breath sounds. Examination of the left-upper field reveals decreased breath sounds. Decreased breath sounds present.     Comments: Mild decrease in the Apex, otherwise clear Abdominal:     Palpations: Abdomen is soft.     Tenderness: There is no abdominal tenderness.  Musculoskeletal:        General: No swelling.     Cervical back: Neck supple.  Skin:    General: Skin is warm and dry.     Capillary Refill: Capillary refill takes less than 2 seconds.  Neurological:     Mental Status: He is alert.  Psychiatric:        Mood and Affect: Mood normal.      UC Treatments / Results  Labs (all labs ordered are listed, but only abnormal results are displayed) Labs Reviewed - No data to display  EKG   Radiology DG Chest 2 View Result Date: 10/11/2023 CLINICAL DATA:  Cough, bronchitis, sore throat. EXAM: CHEST - 2 VIEW COMPARISON:  Chest radiograph 02/24/2023. FINDINGS: The heart size and mediastinal contours are within normal limits. Both lungs are clear. The visualized skeletal structures are unremarkable. IMPRESSION: No active  cardiopulmonary disease. Electronically Signed   By: Donnice Mania M.D.   On: 10/11/2023 13:00    Procedures Procedures (including critical care time)  Medications Ordered in UC Medications - No data to display  Initial Impression / Assessment and Plan / UC Course  I have reviewed the triage vital signs and the nursing notes.  Pertinent labs & imaging results that were available during my care of the patient were reviewed by me and considered in my medical decision making (see chart for details).     Acute cough - Plan: DG Chest 2 View, DG Chest 2 View  Essential hypertension  Tachycardia  Medication refill   Chest x-ray done today.  Final evaluation by the radiologist still pending but on brief evaluation there does not appear to be any acute or infectious process.  Symptoms most likely residual cough from recently upper respiratory illness but not secondary to an infectious process. This does not require antibiotics.  Your vital signs and physical exam findings are reassuring.  We will attempt to improve the symptoms with the following:  Prednisone  40 mg (2 tablets) once daily for 5 days. Take this in the morning.  Last creatinine 0.73 in May 2025 May continue to use Benzonatate  for cough  Make sure to stay hydrated and drink plenty of fluids.  Return to urgent care  or PCP if symptoms worsen or fail to resolve.    For your hypertension medications, we will refill these with enough to get you to your primary care appointment that is scheduled for 7/30 at 8:20. Please do not miss this appointment. It is important for you to establish care with them for management of your chronic medical issues. We have refilled the following medications:  Amlodipine  10 mg daily.  Losartan /hydrochlorothiazide  100/25 mg daily. Metoprolol  XL 25 mg daily.  Final Clinical Impressions(s) / UC Diagnoses   Final diagnoses:  Acute cough  Medication refill     Discharge Instructions      Chest  x-ray done today.  Final evaluation by the radiologist still pending but on brief evaluation there does not appear to be any acute or infectious process.  Symptoms most likely residual cough from recently upper respiratory illness but not secondary to an infectious process. This does not require antibiotics.  Your vital signs and physical exam findings are reassuring.  We will attempt to improve the symptoms with the following:  Prednisone  40 mg (2 tablets) once daily for 5 days. Take this in the morning.  This is a steroid to help with inflammation and pain. May continue to use Benzonatate  for cough  Make sure to stay hydrated and drink plenty of fluids.  Return to urgent care or PCP if symptoms worsen or fail to resolve.    For your hypertension medications, we will refill these with enough to get you to your primary care appointment that is scheduled for 7/30 at 8:20. Please do not miss this appointment. It is important for you to establish care with them for management of your chronic medical issues. We have refilled the following medications:  Amlodipine  10 mg daily.  Losartan /hydrochlorothiazide  100/25 mg daily. Metoprolol  XL 25 mg daily.    ED Prescriptions   None    PDMP not reviewed this encounter.   Teresa Almarie LABOR, PA-C 10/11/23 1306

## 2023-10-19 ENCOUNTER — Ambulatory Visit: Admitting: Family Medicine
